# Patient Record
Sex: Male | Born: 1970 | Race: White | Hispanic: No | Marital: Single | Smoking: Former smoker
Health system: Southern US, Community
[De-identification: ages and names within clinical notes are randomized; demographics above are authoritative.]

## PROBLEM LIST (undated history)

## (undated) DIAGNOSIS — Z789 Other specified health status: Secondary | ICD-10-CM

## (undated) DIAGNOSIS — M419 Scoliosis, unspecified: Secondary | ICD-10-CM

## (undated) DIAGNOSIS — E7800 Pure hypercholesterolemia, unspecified: Secondary | ICD-10-CM

## (undated) DIAGNOSIS — F319 Bipolar disorder, unspecified: Secondary | ICD-10-CM

## (undated) DIAGNOSIS — S069XAA Unspecified intracranial injury with loss of consciousness status unknown, initial encounter: Secondary | ICD-10-CM

## (undated) DIAGNOSIS — F909 Attention-deficit hyperactivity disorder, unspecified type: Secondary | ICD-10-CM

## (undated) DIAGNOSIS — F419 Anxiety disorder, unspecified: Secondary | ICD-10-CM

## (undated) DIAGNOSIS — F32A Depression, unspecified: Secondary | ICD-10-CM

## (undated) DIAGNOSIS — F329 Major depressive disorder, single episode, unspecified: Secondary | ICD-10-CM

## (undated) DIAGNOSIS — S069X9A Unspecified intracranial injury with loss of consciousness of unspecified duration, initial encounter: Secondary | ICD-10-CM

---
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
## 2001-08-26 ENCOUNTER — Encounter: Payer: Self-pay | Admitting: Neurosurgery

## 2001-08-26 ENCOUNTER — Encounter: Admission: RE | Admit: 2001-08-26 | Discharge: 2001-08-26 | Payer: Self-pay | Admitting: Neurosurgery

## 2001-09-09 ENCOUNTER — Encounter: Payer: Self-pay | Admitting: Neurosurgery

## 2001-09-09 ENCOUNTER — Encounter: Admission: RE | Admit: 2001-09-09 | Discharge: 2001-09-09 | Payer: Self-pay | Admitting: Neurosurgery

## 2001-09-23 ENCOUNTER — Encounter: Payer: Self-pay | Admitting: Neurosurgery

## 2001-09-23 ENCOUNTER — Encounter: Admission: RE | Admit: 2001-09-23 | Discharge: 2001-09-23 | Payer: Self-pay | Admitting: Neurosurgery

## 2008-06-29 ENCOUNTER — Ambulatory Visit (HOSPITAL_COMMUNITY): Payer: Self-pay | Admitting: Psychology

## 2008-07-11 ENCOUNTER — Ambulatory Visit (HOSPITAL_COMMUNITY): Payer: Self-pay | Admitting: Psychology

## 2008-08-01 ENCOUNTER — Ambulatory Visit (HOSPITAL_COMMUNITY): Payer: Self-pay | Admitting: Psychology

## 2008-08-22 ENCOUNTER — Ambulatory Visit (HOSPITAL_COMMUNITY): Payer: Self-pay | Admitting: Psychology

## 2008-09-18 ENCOUNTER — Ambulatory Visit (HOSPITAL_COMMUNITY): Payer: Self-pay | Admitting: Psychology

## 2008-10-09 ENCOUNTER — Ambulatory Visit (HOSPITAL_COMMUNITY): Payer: Self-pay | Admitting: Psychology

## 2008-10-31 ENCOUNTER — Ambulatory Visit (HOSPITAL_COMMUNITY): Payer: Self-pay | Admitting: Psychology

## 2008-11-28 ENCOUNTER — Ambulatory Visit (HOSPITAL_COMMUNITY): Payer: Self-pay | Admitting: Psychology

## 2008-12-19 ENCOUNTER — Ambulatory Visit (HOSPITAL_COMMUNITY): Payer: Self-pay | Admitting: Psychology

## 2009-02-16 ENCOUNTER — Ambulatory Visit (HOSPITAL_COMMUNITY): Payer: Self-pay | Admitting: Psychology

## 2009-03-15 ENCOUNTER — Ambulatory Visit (HOSPITAL_COMMUNITY): Payer: Self-pay | Admitting: Psychology

## 2010-10-15 ENCOUNTER — Ambulatory Visit
Admission: RE | Admit: 2010-10-15 | Discharge: 2010-10-15 | Payer: Self-pay | Source: Home / Self Care | Attending: Neurology | Admitting: Neurology

## 2017-04-22 ENCOUNTER — Encounter: Payer: Self-pay | Admitting: Orthopaedic Surgery

## 2017-05-12 ENCOUNTER — Ambulatory Visit (INDEPENDENT_AMBULATORY_CARE_PROVIDER_SITE_OTHER): Payer: Medicare Other | Admitting: Urology

## 2017-05-12 DIAGNOSIS — E291 Testicular hypofunction: Secondary | ICD-10-CM

## 2017-08-18 ENCOUNTER — Ambulatory Visit: Payer: Medicare Other | Admitting: Urology

## 2017-08-18 DIAGNOSIS — E291 Testicular hypofunction: Secondary | ICD-10-CM | POA: Diagnosis not present

## 2017-10-05 DIAGNOSIS — Z79891 Long term (current) use of opiate analgesic: Secondary | ICD-10-CM | POA: Diagnosis not present

## 2017-10-05 DIAGNOSIS — M130 Polyarthritis, unspecified: Secondary | ICD-10-CM | POA: Diagnosis not present

## 2017-10-05 DIAGNOSIS — M545 Low back pain: Secondary | ICD-10-CM | POA: Diagnosis not present

## 2017-10-05 DIAGNOSIS — K5903 Drug induced constipation: Secondary | ICD-10-CM | POA: Diagnosis not present

## 2017-10-26 DIAGNOSIS — Z79899 Other long term (current) drug therapy: Secondary | ICD-10-CM | POA: Diagnosis not present

## 2017-12-03 DIAGNOSIS — M130 Polyarthritis, unspecified: Secondary | ICD-10-CM | POA: Diagnosis not present

## 2017-12-03 DIAGNOSIS — Z79891 Long term (current) use of opiate analgesic: Secondary | ICD-10-CM | POA: Diagnosis not present

## 2017-12-03 DIAGNOSIS — M545 Low back pain: Secondary | ICD-10-CM | POA: Diagnosis not present

## 2017-12-14 DIAGNOSIS — E291 Testicular hypofunction: Secondary | ICD-10-CM | POA: Diagnosis not present

## 2017-12-22 ENCOUNTER — Ambulatory Visit: Payer: Medicare Other | Admitting: Urology

## 2017-12-22 DIAGNOSIS — E291 Testicular hypofunction: Secondary | ICD-10-CM

## 2017-12-24 DIAGNOSIS — G894 Chronic pain syndrome: Secondary | ICD-10-CM | POA: Diagnosis not present

## 2017-12-24 DIAGNOSIS — R7301 Impaired fasting glucose: Secondary | ICD-10-CM | POA: Diagnosis not present

## 2017-12-24 DIAGNOSIS — R5381 Other malaise: Secondary | ICD-10-CM | POA: Diagnosis not present

## 2017-12-24 DIAGNOSIS — E782 Mixed hyperlipidemia: Secondary | ICD-10-CM | POA: Diagnosis not present

## 2017-12-28 DIAGNOSIS — K5900 Constipation, unspecified: Secondary | ICD-10-CM | POA: Diagnosis not present

## 2017-12-28 DIAGNOSIS — G894 Chronic pain syndrome: Secondary | ICD-10-CM | POA: Diagnosis not present

## 2017-12-28 DIAGNOSIS — K219 Gastro-esophageal reflux disease without esophagitis: Secondary | ICD-10-CM | POA: Diagnosis not present

## 2018-02-01 DIAGNOSIS — M545 Low back pain: Secondary | ICD-10-CM | POA: Diagnosis not present

## 2018-02-01 DIAGNOSIS — M130 Polyarthritis, unspecified: Secondary | ICD-10-CM | POA: Diagnosis not present

## 2018-02-01 DIAGNOSIS — Z79891 Long term (current) use of opiate analgesic: Secondary | ICD-10-CM | POA: Diagnosis not present

## 2018-02-01 DIAGNOSIS — K5903 Drug induced constipation: Secondary | ICD-10-CM | POA: Diagnosis not present

## 2018-02-09 ENCOUNTER — Other Ambulatory Visit: Payer: Self-pay

## 2018-02-09 NOTE — Patient Outreach (Signed)
[facility]) Care Management  02/09/2018  Omar Sutton Dec 12, 1970 [id number]   Medication Adherence call to Omar Sutton patient telephone number under [facility] belongs to someone else ,Epic has a number but not sure if this is a correct number left a message for patient to call back patient is due on Rosuvastatin 20 mg.    [facility] Management Direct Dial [phone]  Fax [phone] [email or web address]

## 2018-03-03 DIAGNOSIS — K5903 Drug induced constipation: Secondary | ICD-10-CM | POA: Diagnosis not present

## 2018-03-03 DIAGNOSIS — M461 Sacroiliitis, not elsewhere classified: Secondary | ICD-10-CM | POA: Diagnosis not present

## 2018-03-03 DIAGNOSIS — M130 Polyarthritis, unspecified: Secondary | ICD-10-CM | POA: Diagnosis not present

## 2018-03-08 ENCOUNTER — Other Ambulatory Visit: Payer: Self-pay

## 2018-03-08 NOTE — Patient Outreach (Signed)
[facility]) Care Management  03/08/2018  Omar Sutton Jan 31, 1971 [id number]   Medication Adherence call to Mr. Omar Sutton spoke with patient he said he is not taking this medication doctor took him off and told him to take fish oil for three month he is going to have an appointment in July/2019 and will go over it with doctor again.Omar Sutton is showing past due under [place] [employer]. On Rosuvastatin 20 mg.   [facility] Management Direct Dial [phone]  Fax [phone] [name]

## 2018-03-25 DIAGNOSIS — M545 Low back pain: Secondary | ICD-10-CM | POA: Diagnosis not present

## 2018-03-25 DIAGNOSIS — M461 Sacroiliitis, not elsewhere classified: Secondary | ICD-10-CM | POA: Diagnosis not present

## 2018-03-28 DIAGNOSIS — E559 Vitamin D deficiency, unspecified: Secondary | ICD-10-CM | POA: Diagnosis not present

## 2018-03-28 DIAGNOSIS — Z0000 Encounter for general adult medical examination without abnormal findings: Secondary | ICD-10-CM | POA: Diagnosis not present

## 2018-03-28 DIAGNOSIS — R7301 Impaired fasting glucose: Secondary | ICD-10-CM | POA: Diagnosis not present

## 2018-03-31 DIAGNOSIS — M461 Sacroiliitis, not elsewhere classified: Secondary | ICD-10-CM | POA: Diagnosis not present

## 2018-03-31 DIAGNOSIS — M545 Low back pain: Secondary | ICD-10-CM | POA: Diagnosis not present

## 2018-03-31 DIAGNOSIS — Z79899 Other long term (current) drug therapy: Secondary | ICD-10-CM | POA: Diagnosis not present

## 2018-03-31 DIAGNOSIS — M130 Polyarthritis, unspecified: Secondary | ICD-10-CM | POA: Diagnosis not present

## 2018-03-31 DIAGNOSIS — Z79891 Long term (current) use of opiate analgesic: Secondary | ICD-10-CM | POA: Diagnosis not present

## 2018-03-31 DIAGNOSIS — K5903 Drug induced constipation: Secondary | ICD-10-CM | POA: Diagnosis not present

## 2018-04-02 DIAGNOSIS — E559 Vitamin D deficiency, unspecified: Secondary | ICD-10-CM | POA: Diagnosis not present

## 2018-04-02 DIAGNOSIS — E782 Mixed hyperlipidemia: Secondary | ICD-10-CM | POA: Diagnosis not present

## 2018-04-02 DIAGNOSIS — K219 Gastro-esophageal reflux disease without esophagitis: Secondary | ICD-10-CM | POA: Diagnosis not present

## 2018-04-02 DIAGNOSIS — G894 Chronic pain syndrome: Secondary | ICD-10-CM | POA: Diagnosis not present

## 2018-04-22 DIAGNOSIS — M545 Low back pain: Secondary | ICD-10-CM | POA: Diagnosis not present

## 2018-04-22 DIAGNOSIS — M62830 Muscle spasm of back: Secondary | ICD-10-CM | POA: Diagnosis not present

## 2018-04-22 DIAGNOSIS — E559 Vitamin D deficiency, unspecified: Secondary | ICD-10-CM | POA: Diagnosis not present

## 2018-05-31 DIAGNOSIS — M461 Sacroiliitis, not elsewhere classified: Secondary | ICD-10-CM | POA: Diagnosis not present

## 2018-05-31 DIAGNOSIS — M130 Polyarthritis, unspecified: Secondary | ICD-10-CM | POA: Diagnosis not present

## 2018-05-31 DIAGNOSIS — Z79899 Other long term (current) drug therapy: Secondary | ICD-10-CM | POA: Diagnosis not present

## 2018-05-31 DIAGNOSIS — M545 Low back pain: Secondary | ICD-10-CM | POA: Diagnosis not present

## 2018-05-31 DIAGNOSIS — K5903 Drug induced constipation: Secondary | ICD-10-CM | POA: Diagnosis not present

## 2018-05-31 DIAGNOSIS — Z79891 Long term (current) use of opiate analgesic: Secondary | ICD-10-CM | POA: Diagnosis not present

## 2018-06-22 ENCOUNTER — Ambulatory Visit: Payer: Medicare Other | Admitting: Urology

## 2018-06-22 DIAGNOSIS — E291 Testicular hypofunction: Secondary | ICD-10-CM | POA: Diagnosis not present

## 2018-08-03 DIAGNOSIS — E559 Vitamin D deficiency, unspecified: Secondary | ICD-10-CM | POA: Diagnosis not present

## 2018-08-03 DIAGNOSIS — K219 Gastro-esophageal reflux disease without esophagitis: Secondary | ICD-10-CM | POA: Diagnosis not present

## 2018-08-24 DIAGNOSIS — Z79891 Long term (current) use of opiate analgesic: Secondary | ICD-10-CM | POA: Diagnosis not present

## 2018-08-24 DIAGNOSIS — M545 Low back pain: Secondary | ICD-10-CM | POA: Diagnosis not present

## 2018-08-24 DIAGNOSIS — Z79899 Other long term (current) drug therapy: Secondary | ICD-10-CM | POA: Diagnosis not present

## 2018-08-24 DIAGNOSIS — K5903 Drug induced constipation: Secondary | ICD-10-CM | POA: Diagnosis not present

## 2018-08-24 DIAGNOSIS — M130 Polyarthritis, unspecified: Secondary | ICD-10-CM | POA: Diagnosis not present

## 2018-08-24 DIAGNOSIS — M461 Sacroiliitis, not elsewhere classified: Secondary | ICD-10-CM | POA: Diagnosis not present

## 2018-10-04 DIAGNOSIS — R7301 Impaired fasting glucose: Secondary | ICD-10-CM | POA: Diagnosis not present

## 2018-10-04 DIAGNOSIS — E559 Vitamin D deficiency, unspecified: Secondary | ICD-10-CM | POA: Diagnosis not present

## 2018-10-04 DIAGNOSIS — E782 Mixed hyperlipidemia: Secondary | ICD-10-CM | POA: Diagnosis not present

## 2018-10-08 DIAGNOSIS — K219 Gastro-esophageal reflux disease without esophagitis: Secondary | ICD-10-CM | POA: Diagnosis not present

## 2018-10-08 DIAGNOSIS — E559 Vitamin D deficiency, unspecified: Secondary | ICD-10-CM | POA: Diagnosis not present

## 2018-10-08 DIAGNOSIS — E782 Mixed hyperlipidemia: Secondary | ICD-10-CM | POA: Diagnosis not present

## 2018-10-12 DIAGNOSIS — E782 Mixed hyperlipidemia: Secondary | ICD-10-CM | POA: Diagnosis not present

## 2018-10-12 DIAGNOSIS — G894 Chronic pain syndrome: Secondary | ICD-10-CM | POA: Diagnosis not present

## 2018-10-12 DIAGNOSIS — K219 Gastro-esophageal reflux disease without esophagitis: Secondary | ICD-10-CM | POA: Diagnosis not present

## 2018-10-22 DIAGNOSIS — H524 Presbyopia: Secondary | ICD-10-CM | POA: Diagnosis not present

## 2018-10-28 DIAGNOSIS — E782 Mixed hyperlipidemia: Secondary | ICD-10-CM | POA: Diagnosis not present

## 2018-10-28 DIAGNOSIS — K219 Gastro-esophageal reflux disease without esophagitis: Secondary | ICD-10-CM | POA: Diagnosis not present

## 2018-11-03 DIAGNOSIS — Z79899 Other long term (current) drug therapy: Secondary | ICD-10-CM | POA: Diagnosis not present

## 2018-11-03 DIAGNOSIS — K5903 Drug induced constipation: Secondary | ICD-10-CM | POA: Diagnosis not present

## 2018-11-03 DIAGNOSIS — M130 Polyarthritis, unspecified: Secondary | ICD-10-CM | POA: Diagnosis not present

## 2018-11-03 DIAGNOSIS — M461 Sacroiliitis, not elsewhere classified: Secondary | ICD-10-CM | POA: Diagnosis not present

## 2018-11-23 DIAGNOSIS — L02212 Cutaneous abscess of back [any part, except buttock]: Secondary | ICD-10-CM | POA: Diagnosis not present

## 2018-11-23 DIAGNOSIS — E782 Mixed hyperlipidemia: Secondary | ICD-10-CM | POA: Diagnosis not present

## 2018-11-23 DIAGNOSIS — K219 Gastro-esophageal reflux disease without esophagitis: Secondary | ICD-10-CM | POA: Diagnosis not present

## 2018-12-20 DIAGNOSIS — E782 Mixed hyperlipidemia: Secondary | ICD-10-CM | POA: Diagnosis not present

## 2018-12-20 DIAGNOSIS — K219 Gastro-esophageal reflux disease without esophagitis: Secondary | ICD-10-CM | POA: Diagnosis not present

## 2019-01-18 DIAGNOSIS — K219 Gastro-esophageal reflux disease without esophagitis: Secondary | ICD-10-CM | POA: Diagnosis not present

## 2019-01-18 DIAGNOSIS — E782 Mixed hyperlipidemia: Secondary | ICD-10-CM | POA: Diagnosis not present

## 2019-01-21 DIAGNOSIS — Z0000 Encounter for general adult medical examination without abnormal findings: Secondary | ICD-10-CM | POA: Diagnosis not present

## 2019-02-04 DIAGNOSIS — E782 Mixed hyperlipidemia: Secondary | ICD-10-CM | POA: Diagnosis not present

## 2019-02-04 DIAGNOSIS — R7301 Impaired fasting glucose: Secondary | ICD-10-CM | POA: Diagnosis not present

## 2019-02-04 DIAGNOSIS — E559 Vitamin D deficiency, unspecified: Secondary | ICD-10-CM | POA: Diagnosis not present

## 2019-02-11 DIAGNOSIS — E559 Vitamin D deficiency, unspecified: Secondary | ICD-10-CM | POA: Diagnosis not present

## 2019-02-11 DIAGNOSIS — Z0001 Encounter for general adult medical examination with abnormal findings: Secondary | ICD-10-CM | POA: Diagnosis not present

## 2019-02-11 DIAGNOSIS — K219 Gastro-esophageal reflux disease without esophagitis: Secondary | ICD-10-CM | POA: Diagnosis not present

## 2019-02-11 DIAGNOSIS — E782 Mixed hyperlipidemia: Secondary | ICD-10-CM | POA: Diagnosis not present

## 2019-03-02 DIAGNOSIS — E782 Mixed hyperlipidemia: Secondary | ICD-10-CM | POA: Diagnosis not present

## 2019-03-02 DIAGNOSIS — K219 Gastro-esophageal reflux disease without esophagitis: Secondary | ICD-10-CM | POA: Diagnosis not present

## 2019-03-29 DIAGNOSIS — K219 Gastro-esophageal reflux disease without esophagitis: Secondary | ICD-10-CM | POA: Diagnosis not present

## 2019-03-29 DIAGNOSIS — E782 Mixed hyperlipidemia: Secondary | ICD-10-CM | POA: Diagnosis not present

## 2019-04-18 DIAGNOSIS — E782 Mixed hyperlipidemia: Secondary | ICD-10-CM | POA: Diagnosis not present

## 2019-04-18 DIAGNOSIS — K219 Gastro-esophageal reflux disease without esophagitis: Secondary | ICD-10-CM | POA: Diagnosis not present

## 2019-05-10 DIAGNOSIS — M25552 Pain in left hip: Secondary | ICD-10-CM | POA: Diagnosis not present

## 2019-05-27 DIAGNOSIS — M25552 Pain in left hip: Secondary | ICD-10-CM | POA: Diagnosis not present

## 2019-05-27 DIAGNOSIS — M545 Low back pain: Secondary | ICD-10-CM | POA: Diagnosis not present

## 2019-06-01 DIAGNOSIS — M5136 Other intervertebral disc degeneration, lumbar region: Secondary | ICD-10-CM | POA: Diagnosis not present

## 2019-06-01 DIAGNOSIS — M48061 Spinal stenosis, lumbar region without neurogenic claudication: Secondary | ICD-10-CM | POA: Diagnosis not present

## 2019-06-01 DIAGNOSIS — M419 Scoliosis, unspecified: Secondary | ICD-10-CM | POA: Diagnosis not present

## 2019-06-01 DIAGNOSIS — M549 Dorsalgia, unspecified: Secondary | ICD-10-CM | POA: Diagnosis not present

## 2019-06-01 DIAGNOSIS — M545 Low back pain: Secondary | ICD-10-CM | POA: Diagnosis not present

## 2019-06-09 DIAGNOSIS — E782 Mixed hyperlipidemia: Secondary | ICD-10-CM | POA: Diagnosis not present

## 2019-06-09 DIAGNOSIS — K219 Gastro-esophageal reflux disease without esophagitis: Secondary | ICD-10-CM | POA: Diagnosis not present

## 2019-06-24 DIAGNOSIS — K219 Gastro-esophageal reflux disease without esophagitis: Secondary | ICD-10-CM | POA: Diagnosis not present

## 2019-06-24 DIAGNOSIS — E782 Mixed hyperlipidemia: Secondary | ICD-10-CM | POA: Diagnosis not present

## 2019-07-06 DIAGNOSIS — L720 Epidermal cyst: Secondary | ICD-10-CM | POA: Diagnosis not present

## 2019-08-03 DIAGNOSIS — E782 Mixed hyperlipidemia: Secondary | ICD-10-CM | POA: Diagnosis not present

## 2019-08-18 DIAGNOSIS — Q330 Congenital cystic lung: Secondary | ICD-10-CM | POA: Diagnosis not present

## 2019-08-18 DIAGNOSIS — D485 Neoplasm of uncertain behavior of skin: Secondary | ICD-10-CM | POA: Diagnosis not present

## 2019-08-31 DIAGNOSIS — E7849 Other hyperlipidemia: Secondary | ICD-10-CM | POA: Diagnosis not present

## 2019-09-29 DIAGNOSIS — E7849 Other hyperlipidemia: Secondary | ICD-10-CM | POA: Diagnosis not present

## 2019-09-30 DIAGNOSIS — M25552 Pain in left hip: Secondary | ICD-10-CM | POA: Diagnosis not present

## 2019-10-25 DIAGNOSIS — M5416 Radiculopathy, lumbar region: Secondary | ICD-10-CM | POA: Diagnosis not present

## 2019-10-25 DIAGNOSIS — M5126 Other intervertebral disc displacement, lumbar region: Secondary | ICD-10-CM | POA: Diagnosis not present

## 2019-10-25 DIAGNOSIS — M545 Low back pain: Secondary | ICD-10-CM | POA: Diagnosis not present

## 2019-11-09 ENCOUNTER — Emergency Department (HOSPITAL_COMMUNITY): Payer: Medicare Other

## 2019-11-09 ENCOUNTER — Other Ambulatory Visit: Payer: Self-pay

## 2019-11-09 ENCOUNTER — Encounter (HOSPITAL_COMMUNITY): Payer: Self-pay | Admitting: Radiology

## 2019-11-09 ENCOUNTER — Emergency Department (HOSPITAL_COMMUNITY)
Admission: EM | Admit: 2019-11-09 | Discharge: 2019-11-10 | Disposition: A | Discharge status: Home / Self Care | Payer: Medicare Other | Attending: Emergency Medicine | Admitting: Emergency Medicine

## 2019-11-09 DIAGNOSIS — S3993XA Unspecified injury of pelvis, initial encounter: Secondary | ICD-10-CM | POA: Diagnosis not present

## 2019-11-09 DIAGNOSIS — Y92410 Unspecified street and highway as the place of occurrence of the external cause: Secondary | ICD-10-CM | POA: Insufficient documentation

## 2019-11-09 DIAGNOSIS — Y999 Unspecified external cause status: Secondary | ICD-10-CM | POA: Insufficient documentation

## 2019-11-09 DIAGNOSIS — Y9389 Activity, other specified: Secondary | ICD-10-CM | POA: Diagnosis not present

## 2019-11-09 DIAGNOSIS — S6992XA Unspecified injury of left wrist, hand and finger(s), initial encounter: Secondary | ICD-10-CM | POA: Diagnosis present

## 2019-11-09 DIAGNOSIS — S62395A Other fracture of fourth metacarpal bone, left hand, initial encounter for closed fracture: Secondary | ICD-10-CM | POA: Diagnosis not present

## 2019-11-09 DIAGNOSIS — S199XXA Unspecified injury of neck, initial encounter: Secondary | ICD-10-CM | POA: Diagnosis not present

## 2019-11-09 DIAGNOSIS — R519 Headache, unspecified: Secondary | ICD-10-CM | POA: Insufficient documentation

## 2019-11-09 DIAGNOSIS — R52 Pain, unspecified: Secondary | ICD-10-CM | POA: Diagnosis not present

## 2019-11-09 DIAGNOSIS — R000 Tachycardia, unspecified: Secondary | ICD-10-CM | POA: Diagnosis not present

## 2019-11-09 DIAGNOSIS — S0990XA Unspecified injury of head, initial encounter: Secondary | ICD-10-CM | POA: Diagnosis not present

## 2019-11-09 DIAGNOSIS — W19XXXA Unspecified fall, initial encounter: Secondary | ICD-10-CM

## 2019-11-09 DIAGNOSIS — S62325A Displaced fracture of shaft of fourth metacarpal bone, left hand, initial encounter for closed fracture: Secondary | ICD-10-CM | POA: Diagnosis not present

## 2019-11-09 DIAGNOSIS — S3991XA Unspecified injury of abdomen, initial encounter: Secondary | ICD-10-CM | POA: Diagnosis not present

## 2019-11-09 DIAGNOSIS — S62355A Nondisplaced fracture of shaft of fourth metacarpal bone, left hand, initial encounter for closed fracture: Secondary | ICD-10-CM | POA: Diagnosis not present

## 2019-11-09 DIAGNOSIS — S62397A Other fracture of fifth metacarpal bone, left hand, initial encounter for closed fracture: Secondary | ICD-10-CM | POA: Diagnosis not present

## 2019-11-09 DIAGNOSIS — S6292XA Unspecified fracture of left wrist and hand, initial encounter for closed fracture: Secondary | ICD-10-CM

## 2019-11-09 DIAGNOSIS — R58 Hemorrhage, not elsewhere classified: Secondary | ICD-10-CM | POA: Diagnosis not present

## 2019-11-09 DIAGNOSIS — R0902 Hypoxemia: Secondary | ICD-10-CM | POA: Diagnosis not present

## 2019-11-09 DIAGNOSIS — E782 Mixed hyperlipidemia: Secondary | ICD-10-CM | POA: Diagnosis not present

## 2019-11-09 DIAGNOSIS — M542 Cervicalgia: Secondary | ICD-10-CM | POA: Diagnosis not present

## 2019-11-09 DIAGNOSIS — R222 Localized swelling, mass and lump, trunk: Secondary | ICD-10-CM | POA: Diagnosis not present

## 2019-11-09 DIAGNOSIS — S62615A Displaced fracture of proximal phalanx of left ring finger, initial encounter for closed fracture: Secondary | ICD-10-CM | POA: Diagnosis not present

## 2019-11-09 DIAGNOSIS — S3992XA Unspecified injury of lower back, initial encounter: Secondary | ICD-10-CM | POA: Diagnosis not present

## 2019-11-09 DIAGNOSIS — Z23 Encounter for immunization: Secondary | ICD-10-CM | POA: Diagnosis not present

## 2019-11-09 DIAGNOSIS — M438X6 Other specified deforming dorsopathies, lumbar region: Secondary | ICD-10-CM | POA: Diagnosis not present

## 2019-11-09 DIAGNOSIS — S299XXA Unspecified injury of thorax, initial encounter: Secondary | ICD-10-CM | POA: Diagnosis not present

## 2019-11-09 DIAGNOSIS — R0689 Other abnormalities of breathing: Secondary | ICD-10-CM | POA: Diagnosis not present

## 2019-11-09 LAB — CBC
HCT: 47.8 % (ref 39.0–52.0)
Hemoglobin: 15.9 g/dL (ref 13.0–17.0)
MCH: 30.7 pg (ref 26.0–34.0)
MCHC: 33.3 g/dL (ref 30.0–36.0)
MCV: 92.3 fL (ref 80.0–100.0)
Platelets: 253 10*3/uL (ref 150–400)
RBC: 5.18 MIL/uL (ref 4.22–5.81)
RDW: 12.3 % (ref 11.5–15.5)
WBC: 15.9 10*3/uL — ABNORMAL HIGH (ref 4.0–10.5)
nRBC: 0 % (ref 0.0–0.2)

## 2019-11-09 LAB — COMPREHENSIVE METABOLIC PANEL WITH GFR
ALT: 23 U/L (ref 0–44)
AST: 24 U/L (ref 15–41)
Albumin: 4.1 g/dL (ref 3.5–5.0)
Alkaline Phosphatase: 47 U/L (ref 38–126)
Anion gap: 8 (ref 5–15)
BUN: 13 mg/dL (ref 6–20)
CO2: 28 mmol/L (ref 22–32)
Calcium: 9.4 mg/dL (ref 8.9–10.3)
Chloride: 106 mmol/L (ref 98–111)
Creatinine, Ser: 1.16 mg/dL (ref 0.61–1.24)
GFR calc Af Amer: >60 mL/min
GFR calc non Af Amer: >60 mL/min
Glucose, Bld: 108 mg/dL — ABNORMAL HIGH (ref 70–99)
Potassium: 4.6 mmol/L (ref 3.5–5.1)
Sodium: 142 mmol/L (ref 135–145)
Total Bilirubin: 0.8 mg/dL (ref 0.3–1.2)
Total Protein: 6.6 g/dL (ref 6.5–8.1)

## 2019-11-09 LAB — I-STAT CHEM 8, ED
BUN: 15 mg/dL (ref 6–20)
Calcium, Ion: 1.18 mmol/L (ref 1.15–1.40)
Chloride: 105 mmol/L (ref 98–111)
Creatinine, Ser: 1.1 mg/dL (ref 0.61–1.24)
Glucose, Bld: 101 mg/dL — ABNORMAL HIGH (ref 70–99)
HCT: 46 % (ref 39.0–52.0)
Hemoglobin: 15.6 g/dL (ref 13.0–17.0)
Potassium: 4.5 mmol/L (ref 3.5–5.1)
Sodium: 142 mmol/L (ref 135–145)
TCO2: 28 mmol/L (ref 22–32)

## 2019-11-09 LAB — CBG MONITORING, ED: Glucose-Capillary: 98 mg/dL (ref 70–99)

## 2019-11-09 LAB — ETHANOL: Alcohol, Ethyl (B): <10 mg/dL

## 2019-11-09 MED ORDER — IOHEXOL 300 MG/ML  SOLN
100.0000 mL | Freq: Once | INTRAMUSCULAR | Status: AC | PRN
  Administered 2019-11-09: 100 mL via INTRAVENOUS

## 2019-11-09 MED ORDER — LIDOCAINE HCL (PF) 1 % IJ SOLN
5.0000 mL | Freq: Once | INTRAMUSCULAR | Status: AC
  Administered 2019-11-09: 22:00:00 5 mL
  Filled 2019-11-09: qty 5

## 2019-11-09 MED ORDER — TETANUS-DIPHTH-ACELL PERTUSSIS 5-2.5-18.5 LF-MCG/0.5 IM SUSP
0.5000 mL | Freq: Once | INTRAMUSCULAR | Status: AC
  Administered 2019-11-09: 23:00:00 0.5 mL via INTRAMUSCULAR
  Filled 2019-11-09: qty 0.5

## 2019-11-09 MED ORDER — CEPHALEXIN 250 MG PO CAPS
500.0000 mg | ORAL_CAPSULE | Freq: Once | ORAL | Status: AC
  Administered 2019-11-10: 500 mg via ORAL
  Filled 2019-11-09: qty 2

## 2019-11-09 MED ORDER — OXYCODONE-ACETAMINOPHEN 5-325 MG PO TABS: 1.0000 | ORAL_TABLET | ORAL | 0 refills | Status: DC | PRN

## 2019-11-09 MED ORDER — CEPHALEXIN 500 MG PO CAPS: 500.0000 mg | ORAL_CAPSULE | Freq: Four times a day (QID) | ORAL | 0 refills | Status: AC

## 2019-11-09 MED ORDER — OXYCODONE-ACETAMINOPHEN 5-325 MG PO TABS
1.0000 | ORAL_TABLET | Freq: Once | ORAL | Status: AC
  Administered 2019-11-09: 1 via ORAL
  Filled 2019-11-09: qty 1
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
## 2019-11-09 NOTE — ED Notes (Signed)
Pt able to ambulate to and from bathroom without assistance without complaint or impairment to gait

## 2019-11-09 NOTE — Progress Notes (Signed)
Orthopedic Tech Progress Note Patient Details:  Omar Sutton Jun 04, 1971 OS:[id number]  Ortho Devices Type of Ortho Device: Ace wrap, Ulna gutter splint Ortho Device/Splint Location: LUE Ortho Device/Splint Interventions: Ordered, Application   Post Interventions Patient Tolerated: Well Instructions Provided: Care of device   Staci Righter 11/09/2019, 10:01 PM
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
## 2019-11-09 NOTE — Discharge Instructions (Addendum)
Will need to follow-up with Dr. Grandville Silos, hand specialist for the injury to your left hand.  Call his office in the morning to schedule your appt. Leave hand in splint until you follow-up with him in office. Take medications as directed.  Do not drive while taking pain medication. Return here for any new/acute changes.
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
## 2019-11-10 ENCOUNTER — Other Ambulatory Visit: Payer: Self-pay | Admitting: Orthopedic Surgery

## 2019-11-10 ENCOUNTER — Encounter (HOSPITAL_BASED_OUTPATIENT_CLINIC_OR_DEPARTMENT_OTHER): Payer: Self-pay | Admitting: Orthopedic Surgery

## 2019-11-11 ENCOUNTER — Other Ambulatory Visit: Payer: Self-pay

## 2019-11-11 ENCOUNTER — Other Ambulatory Visit (HOSPITAL_COMMUNITY)
Admission: RE | Admit: 2019-11-11 | Discharge: 2019-11-11 | Disposition: A | Discharge status: Home / Self Care | Payer: Medicare Other | Source: Ambulatory Visit | Attending: Orthopedic Surgery | Admitting: Orthopedic Surgery

## 2019-11-11 DIAGNOSIS — Z01812 Encounter for preprocedural laboratory examination: Secondary | ICD-10-CM | POA: Insufficient documentation

## 2019-11-11 DIAGNOSIS — Z20822 Contact with and (suspected) exposure to covid-19: Secondary | ICD-10-CM | POA: Diagnosis not present

## 2019-11-11 LAB — SARS CORONAVIRUS 2 (TAT 6-24 HRS): SARS Coronavirus 2: NEGATIVE

## 2019-11-14 ENCOUNTER — Ambulatory Visit (HOSPITAL_COMMUNITY): Payer: Medicare Other

## 2019-11-14 ENCOUNTER — Ambulatory Visit (HOSPITAL_BASED_OUTPATIENT_CLINIC_OR_DEPARTMENT_OTHER): Payer: Medicare Other | Admitting: Anesthesiology

## 2019-11-14 ENCOUNTER — Encounter (HOSPITAL_BASED_OUTPATIENT_CLINIC_OR_DEPARTMENT_OTHER)
Admission: RE | Disposition: A | Discharge status: Home / Self Care | Payer: Self-pay | Source: Home / Self Care | Attending: Orthopedic Surgery

## 2019-11-14 ENCOUNTER — Ambulatory Visit (HOSPITAL_BASED_OUTPATIENT_CLINIC_OR_DEPARTMENT_OTHER)
Admission: RE | Admit: 2019-11-14 | Discharge: 2019-11-14 | Disposition: A | Discharge status: Home / Self Care | Payer: Medicare Other | Attending: Orthopedic Surgery | Admitting: Orthopedic Surgery

## 2019-11-14 ENCOUNTER — Other Ambulatory Visit: Payer: Self-pay

## 2019-11-14 ENCOUNTER — Encounter (HOSPITAL_BASED_OUTPATIENT_CLINIC_OR_DEPARTMENT_OTHER): Payer: Self-pay | Admitting: Orthopedic Surgery

## 2019-11-14 DIAGNOSIS — S62615D Displaced fracture of proximal phalanx of left ring finger, subsequent encounter for fracture with routine healing: Secondary | ICD-10-CM | POA: Diagnosis not present

## 2019-11-14 DIAGNOSIS — S61201A Unspecified open wound of left index finger without damage to nail, initial encounter: Secondary | ICD-10-CM | POA: Insufficient documentation

## 2019-11-14 DIAGNOSIS — Z87891 Personal history of nicotine dependence: Secondary | ICD-10-CM | POA: Diagnosis not present

## 2019-11-14 DIAGNOSIS — S61203A Unspecified open wound of left middle finger without damage to nail, initial encounter: Secondary | ICD-10-CM | POA: Diagnosis not present

## 2019-11-14 DIAGNOSIS — S62614A Displaced fracture of proximal phalanx of right ring finger, initial encounter for closed fracture: Secondary | ICD-10-CM | POA: Diagnosis not present

## 2019-11-14 DIAGNOSIS — S62615A Displaced fracture of proximal phalanx of left ring finger, initial encounter for closed fracture: Secondary | ICD-10-CM | POA: Diagnosis not present

## 2019-11-14 DIAGNOSIS — Z419 Encounter for procedure for purposes other than remedying health state, unspecified: Secondary | ICD-10-CM

## 2019-11-14 DIAGNOSIS — S61207A Unspecified open wound of left little finger without damage to nail, initial encounter: Secondary | ICD-10-CM | POA: Insufficient documentation

## 2019-11-14 HISTORY — PX: OPEN REDUCTION INTERNAL FIXATION (ORIF) PROXIMAL PHALANX: SHX6235

## 2019-11-14 HISTORY — DX: Unspecified intracranial injury with loss of consciousness status unknown, initial encounter: S06.9XAA

## 2019-11-14 HISTORY — DX: Anxiety disorder, unspecified: F41.9

## 2019-11-14 HISTORY — DX: Depression, unspecified: F32.A

## 2019-11-14 HISTORY — DX: Attention-deficit hyperactivity disorder, unspecified type: F90.9

## 2019-11-14 HISTORY — DX: Unspecified intracranial injury with loss of consciousness of unspecified duration, initial encounter: S06.9X9A

## 2019-11-14 HISTORY — DX: Pure hypercholesterolemia, unspecified: E78.00

## 2019-11-14 HISTORY — DX: Other motorcycle driver injured in collision with pedal cycle in traffic accident, initial encounter: V21.49XA

## 2019-11-14 HISTORY — PX: TENDON REPAIR: SHX5111

## 2019-11-14 HISTORY — DX: Bipolar disorder, unspecified: F31.9

## 2019-11-14 HISTORY — DX: Other specified health status: Z78.9

## 2019-11-14 HISTORY — DX: Scoliosis, unspecified: M41.9

## 2019-11-14 LAB — RAPID URINE DRUG SCREEN, HOSP PERFORMED
Amphetamines: POSITIVE — AB
Barbiturates: NOT DETECTED
Benzodiazepines: POSITIVE — AB
Cocaine: NOT DETECTED
Opiates: POSITIVE — AB
Tetrahydrocannabinol: POSITIVE — AB

## 2019-11-14 SURGERY — OPEN REDUCTION INTERNAL FIXATION (ORIF) PROXIMAL PHALANX
Anesthesia: General | Site: Finger | Laterality: Left

## 2019-11-14 MED ORDER — OXYCODONE HCL 5 MG PO TABS
5.0000 mg | ORAL_TABLET | Freq: Once | ORAL | Status: AC | PRN
  Administered 2019-11-14: 5 mg via ORAL

## 2019-11-14 MED ORDER — PROPOFOL 500 MG/50ML IV EMUL
INTRAVENOUS | Status: AC
  Filled 2019-11-14: qty 50

## 2019-11-14 MED ORDER — LIDOCAINE 2% (20 MG/ML) 5 ML SYRINGE
INTRAMUSCULAR | Status: AC
  Filled 2019-11-14: qty 5

## 2019-11-14 MED ORDER — FENTANYL CITRATE (PF) 100 MCG/2ML IJ SOLN
INTRAMUSCULAR | Status: DC | PRN
  Administered 2019-11-14: 100 ug via INTRAVENOUS
  Administered 2019-11-14 (×2): 50 ug via INTRAVENOUS

## 2019-11-14 MED ORDER — OXYCODONE HCL 5 MG PO TABS
ORAL_TABLET | ORAL | Status: AC
  Filled 2019-11-14: qty 1

## 2019-11-14 MED ORDER — FENTANYL CITRATE (PF) 100 MCG/2ML IJ SOLN
INTRAMUSCULAR | Status: AC
  Filled 2019-11-14: qty 2

## 2019-11-14 MED ORDER — DEXMEDETOMIDINE HCL 200 MCG/2ML IV SOLN
INTRAVENOUS | Status: DC | PRN
  Administered 2019-11-14: 12 ug via INTRAVENOUS

## 2019-11-14 MED ORDER — PROMETHAZINE HCL 25 MG/ML IJ SOLN: 6.2500 mg | INTRAMUSCULAR | Status: DC | PRN

## 2019-11-14 MED ORDER — ONDANSETRON HCL 4 MG/2ML IJ SOLN
INTRAMUSCULAR | Status: AC
  Filled 2019-11-14: qty 2

## 2019-11-14 MED ORDER — CEFAZOLIN SODIUM-DEXTROSE 2-4 GM/100ML-% IV SOLN
2.0000 g | INTRAVENOUS | Status: AC
  Administered 2019-11-14: 2 g via INTRAVENOUS

## 2019-11-14 MED ORDER — IBUPROFEN 200 MG PO TABS: 600.0000 mg | ORAL_TABLET | Freq: Four times a day (QID) | ORAL | Status: AC

## 2019-11-14 MED ORDER — DEXMEDETOMIDINE HCL IN NACL 200 MCG/50ML IV SOLN
INTRAVENOUS | Status: AC
  Filled 2019-11-14: qty 50

## 2019-11-14 MED ORDER — HYDROMORPHONE HCL 1 MG/ML IJ SOLN: 0.2500 mg | INTRAMUSCULAR | Status: DC | PRN

## 2019-11-14 MED ORDER — OXYCODONE HCL 5 MG/5ML PO SOLN: 5.0000 mg | Freq: Once | ORAL | Status: AC | PRN

## 2019-11-14 MED ORDER — ONDANSETRON HCL 4 MG/2ML IJ SOLN
INTRAMUSCULAR | Status: DC | PRN
  Administered 2019-11-14: 4 mg via INTRAVENOUS

## 2019-11-14 MED ORDER — CEFAZOLIN SODIUM-DEXTROSE 2-4 GM/100ML-% IV SOLN
INTRAVENOUS | Status: AC
  Filled 2019-11-14: qty 100

## 2019-11-14 MED ORDER — SODIUM CHLORIDE 0.9 % IR SOLN
Status: DC | PRN
  Administered 2019-11-14: 1000 mL

## 2019-11-14 MED ORDER — MIDAZOLAM HCL 5 MG/5ML IJ SOLN
INTRAMUSCULAR | Status: DC | PRN
  Administered 2019-11-14: 2 mg via INTRAVENOUS

## 2019-11-14 MED ORDER — MIDAZOLAM HCL 2 MG/2ML IJ SOLN
INTRAMUSCULAR | Status: AC
  Filled 2019-11-14: qty 2

## 2019-11-14 MED ORDER — ACETAMINOPHEN 325 MG PO TABS: 650.0000 mg | ORAL_TABLET | Freq: Four times a day (QID) | ORAL | Status: AC

## 2019-11-14 MED ORDER — DEXAMETHASONE SODIUM PHOSPHATE 10 MG/ML IJ SOLN
INTRAMUSCULAR | Status: DC | PRN
  Administered 2019-11-14: 5 mg via INTRAVENOUS

## 2019-11-14 MED ORDER — MIDAZOLAM HCL 2 MG/2ML IJ SOLN: 1.0000 mg | INTRAMUSCULAR | Status: DC | PRN

## 2019-11-14 MED ORDER — LACTATED RINGERS IV SOLN: INTRAVENOUS | Status: DC

## 2019-11-14 MED ORDER — CHLORHEXIDINE GLUCONATE 4 % EX LIQD: 60.0000 mL | Freq: Once | CUTANEOUS | Status: DC

## 2019-11-14 MED ORDER — BUPIVACAINE HCL (PF) 0.5 % IJ SOLN
INTRAMUSCULAR | Status: DC | PRN
  Administered 2019-11-14: 10 mL

## 2019-11-14 MED ORDER — OXYCODONE HCL 5 MG PO TABS: 5.0000 mg | ORAL_TABLET | Freq: Four times a day (QID) | ORAL | 0 refills | Status: AC | PRN

## 2019-11-14 MED ORDER — DEXAMETHASONE SODIUM PHOSPHATE 10 MG/ML IJ SOLN
INTRAMUSCULAR | Status: AC
  Filled 2019-11-14: qty 1

## 2019-11-14 MED ORDER — PROPOFOL 10 MG/ML IV BOLUS
INTRAVENOUS | Status: DC | PRN
  Administered 2019-11-14: 200 mg via INTRAVENOUS

## 2019-11-14 MED ORDER — FENTANYL CITRATE (PF) 100 MCG/2ML IJ SOLN: 50.0000 ug | INTRAMUSCULAR | Status: DC | PRN

## 2019-11-14 MED ORDER — MEPERIDINE HCL 25 MG/ML IJ SOLN: 6.2500 mg | INTRAMUSCULAR | Status: DC | PRN

## 2019-11-14 MED ORDER — LIDOCAINE 2% (20 MG/ML) 5 ML SYRINGE
INTRAMUSCULAR | Status: DC | PRN
  Administered 2019-11-14: 60 mg via INTRAVENOUS

## 2019-11-14 MED ORDER — LIDOCAINE HCL (PF) 1 % IJ SOLN
INTRAMUSCULAR | Status: AC
  Filled 2019-11-14: qty 30

## 2019-11-14 SURGICAL SUPPLY — 68 items
APL PRP STRL LF DISP 70% ISPRP (MISCELLANEOUS) ×1
BIT DRILL 1.1 (BIT) ×2
BIT DRILL 1.1MM (BIT) ×1
BIT DRILL 60X20X1.1XQC TMX (BIT) IMPLANT
BIT DRL 60X20X1.1XQC TMX (BIT) ×1
BLADE SURG 15 STRL LF DISP TIS (BLADE) ×2 IMPLANT
BLADE SURG 15 STRL SS (BLADE) ×6
BNDG CMPR 9X4 STRL LF SNTH (GAUZE/BANDAGES/DRESSINGS) ×1
BNDG COHESIVE 2X5 TAN STRL LF (GAUZE/BANDAGES/DRESSINGS) ×2 IMPLANT
BNDG COHESIVE 4X5 TAN STRL (GAUZE/BANDAGES/DRESSINGS) ×3 IMPLANT
BNDG ESMARK 4X9 LF (GAUZE/BANDAGES/DRESSINGS) ×2 IMPLANT
BNDG GAUZE ELAST 4 BULKY (GAUZE/BANDAGES/DRESSINGS) ×3 IMPLANT
BRUSH SCRUB EZ PLAIN DRY (MISCELLANEOUS) ×2 IMPLANT
CAP PIN PROTECTOR ORTHO WHT (CAP) IMPLANT
CHLORAPREP W/TINT 26 (MISCELLANEOUS) ×3 IMPLANT
CORD BIPOLAR FORCEPS 12FT (ELECTRODE) ×3 IMPLANT
COVER BACK TABLE 60X90IN (DRAPES) ×3 IMPLANT
COVER MAYO STAND STRL (DRAPES) ×3 IMPLANT
CUFF TOURN SGL QUICK 18X4 (TOURNIQUET CUFF) IMPLANT
DECANTER SPIKE VIAL GLASS SM (MISCELLANEOUS) ×2 IMPLANT
DRAPE C-ARM 42X72 X-RAY (DRAPES) ×3 IMPLANT
DRAPE EXTREMITY T 121X128X90 (DISPOSABLE) ×3 IMPLANT
DRAPE SURG 17X23 STRL (DRAPES) ×3 IMPLANT
DRIVER BIT 1.5 (TRAUMA) ×2 IMPLANT
DRSG EMULSION OIL 3X3 NADH (GAUZE/BANDAGES/DRESSINGS) ×3 IMPLANT
GAUZE SPONGE 4X4 12PLY STRL LF (GAUZE/BANDAGES/DRESSINGS) ×3 IMPLANT
GLOVE BIO SURGEON STRL SZ7.5 (GLOVE) ×3 IMPLANT
GLOVE BIOGEL PI IND STRL 7.0 (GLOVE) ×1 IMPLANT
GLOVE BIOGEL PI IND STRL 8 (GLOVE) ×1 IMPLANT
GLOVE BIOGEL PI INDICATOR 7.0 (GLOVE) ×2
GLOVE BIOGEL PI INDICATOR 8 (GLOVE) ×2
GLOVE ECLIPSE 6.5 STRL STRAW (GLOVE) ×3 IMPLANT
GOWN STRL REUS W/ TWL LRG LVL3 (GOWN DISPOSABLE) ×2 IMPLANT
GOWN STRL REUS W/TWL LRG LVL3 (GOWN DISPOSABLE) ×6
GOWN STRL REUS W/TWL XL LVL3 (GOWN DISPOSABLE) ×3 IMPLANT
K-WIRE 9  SMOOTH .045 (WIRE) IMPLANT
K-WIRE DBL TRONS .035X6 (WIRE) ×3
KWIRE DBL TRONS .035X6 (WIRE) IMPLANT
LOCK SCREW 1.5X9MM (Screw) ×9 IMPLANT
NDL HYPO 25X1 1.5 SAFETY (NEEDLE) IMPLANT
NEEDLE HYPO 25X1 1.5 SAFETY (NEEDLE) ×3 IMPLANT
NS IRRIG 1000ML POUR BTL (IV SOLUTION) ×3 IMPLANT
PACK BASIN DAY SURGERY FS (CUSTOM PROCEDURE TRAY) ×3 IMPLANT
PADDING CAST ABS 4INX4YD NS (CAST SUPPLIES) ×2
PADDING CAST ABS COTTON 4X4 ST (CAST SUPPLIES) IMPLANT
PLATE T SMALL 1.5MM (Plate) ×2 IMPLANT
SCREW L 1.5X12 (Screw) ×2 IMPLANT
SCREW LOCK 1.5X9MM (Screw) IMPLANT
SCREW LOCKING 1.5X10 (Screw) ×4 IMPLANT
SCREW LOCKING 1.5X13MM (Screw) ×6 IMPLANT
SLING ARM FOAM STRAP LRG (SOFTGOODS) ×2 IMPLANT
SPLINT PLASTER CAST XFAST 3X15 (CAST SUPPLIES) IMPLANT
SPLINT PLASTER XTRA FASTSET 3X (CAST SUPPLIES) ×16
STOCKINETTE 6  STRL (DRAPES) ×2
STOCKINETTE 6 STRL (DRAPES) ×1 IMPLANT
SUCTION FRAZIER HANDLE 10FR (MISCELLANEOUS) ×2
SUCTION TUBE FRAZIER 10FR DISP (MISCELLANEOUS) IMPLANT
SUT FIBERWIRE 4-0 18 TAPR NDL (SUTURE) ×3
SUT MAXBRAID #2 CVD NDL (SUTURE) ×2 IMPLANT
SUT VICRYL 4-0 PS2 18IN ABS (SUTURE) ×2 IMPLANT
SUT VICRYL RAPIDE 4/0 PS 2 (SUTURE) ×4 IMPLANT
SUTURE FIBERWR 4-0 18 TAPR NDL (SUTURE) IMPLANT
SYR BULB 3OZ (MISCELLANEOUS) ×2 IMPLANT
SYR CONTROL 10ML LL (SYRINGE) ×2 IMPLANT
TOWEL GREEN STERILE FF (TOWEL DISPOSABLE) ×3 IMPLANT
TUBE CONNECTING 20'X1/4 (TUBING) ×1
TUBE CONNECTING 20X1/4 (TUBING) ×1 IMPLANT
UNDERPAD 30X36 HEAVY ABSORB (UNDERPADS AND DIAPERS) ×3 IMPLANT

## 2019-11-14 NOTE — Transfer of Care (Signed)
Immediate Anesthesia Transfer of Care Note  Patient: Omar Sutton  Procedure(s) Performed: OPEN REDUCTION INTERNAL FIXATION (ORIF) OF LEFT RING FINGER FRACTURE (Left Finger) TENDON REPAIR, DEBRIDEMENT AND FLAP CLOSURE LEFT INDEX AND LONG FINGERS (Left Finger)  Patient Location: PACU  Anesthesia Type:General  Level of Consciousness: drowsy  Airway & Oxygen Therapy: Patient Spontanous Breathing and Patient connected to face mask oxygen  Post-op Assessment: Report given to RN and Post -op Vital signs reviewed and stable  Post vital signs: Reviewed and stable  Last Vitals:  Vitals Value Taken Time  BP 140/87 11/14/19 1533  Temp    Pulse 84 11/14/19 1534  Resp 43 11/14/19 1535  SpO2 100 % 11/14/19 1534  Vitals shown include unvalidated device data.  Last Pain:  Vitals:   11/14/19 1156  TempSrc: Oral  PainSc: 6          Complications: No apparent anesthesia complications

## 2019-11-14 NOTE — Interval H&P Note (Signed)
History and Physical Interval Note:  11/14/2019 12:51 PM  Omar Sutton  has presented today for surgery, with the diagnosis of LEFT RING FINGER FRACTURE WITH INDEX AND LONG FINGER WOUNDS.  The various methods of treatment have been discussed with the patient and family. After consideration of risks, benefits and other options for treatment, the patient has consented to  Procedure(s): OPEN REDUCTION INTERNAL FIXATION (ORIF) OF LEFT RING FINGER FRACTURE WITH POSSIBLE DEBRIDEMENT OF LONG AND INDEX FINGERS (Left) as a surgical intervention.  The patient's history has been reviewed, patient examined, no change in status, stable for surgery.  I have reviewed the patient's chart and labs.  Questions were answered to the patient's satisfaction.     Jolyn Nap

## 2019-11-14 NOTE — Anesthesia Procedure Notes (Signed)
Procedure Name: LMA Insertion Date/Time: 11/14/2019 1:54 PM Performed by: Lieutenant Diego, CRNA Pre-anesthesia Checklist: Patient identified, Emergency Drugs available, Suction available and Patient being monitored Patient Re-evaluated:Patient Re-evaluated prior to induction Oxygen Delivery Method: Circle system utilized Preoxygenation: Pre-oxygenation with 100% oxygen Induction Type: IV induction Ventilation: Mask ventilation without difficulty LMA: LMA inserted LMA Size: 4.0 Number of attempts: 1 Placement Confirmation: positive ETCO2 and breath sounds checked- equal and bilateral Tube secured with: Tape Dental Injury: Teeth and Oropharynx as per pre-operative assessment
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
## 2019-11-14 NOTE — Discharge Instructions (Signed)
Discharge Instructions   You have a dressing with a plaster splint incorporated in it. Move your fingers as much as possible, making a full fist and fully opening the fist. Elevate your hand to reduce pain & swelling of the digits.  Ice over the operative site may be helpful to reduce pain & swelling.  DO NOT USE HEAT. Pain medicine has been prescribed for you.  TAKE 600 mg IBUPROFEN and 650 mg TYLENOL OTC EVERY 6 HOURS TOGETHER. Oxycodone will be used as a rescue medicine for SEVERE post operative pain. Leave the dressing in place until you return to our office.  You may shower, but keep the bandage clean & dry.  You may drive a car when you are off of prescription pain medications and can safely control your vehicle with both hands. Call our office and return to clinic 10-15 days from the date of surgery for first post operative evaluation.   Please call [phone] during normal business hours or [phone] after hours for any problems. Including the following:  - excessive redness of the incisions - drainage for more than 4 days - fever of more than 101.5 F  *Please note that pain medications will not be refilled after hours or on weekends.  Patient indicates he is not working outside of the home. No activities with the left hand until returning to clinic for first pot operative evaluation.   Post Anesthesia Home Care Instructions  Activity: Get plenty of rest for the remainder of the day. A responsible individual must stay with you for 24 hours following the procedure.  For the next 24 hours, DO NOT: -Drive a car -Paediatric nurse -Drink alcoholic beverages -Take any medication unless instructed by your physician -Make any legal decisions or sign important papers.  Meals: Start with liquid foods such as gelatin or soup. Progress to regular foods as tolerated. Avoid greasy, spicy, heavy foods. If nausea and/or vomiting occur, drink only clear liquids until the nausea  and/or vomiting subsides. Call your physician if vomiting continues.  Special Instructions/Symptoms: Your throat may feel dry or sore from the anesthesia or the breathing tube placed in your throat during surgery. If this causes discomfort, gargle with warm salt water. The discomfort should disappear within 24 hours.  If you had a scopolamine patch placed behind your ear for the management of post- operative nausea and/or vomiting:  1. The medication in the patch is effective for 72 hours, after which it should be removed.  Wrap patch in a tissue and discard in the trash. Wash hands thoroughly with soap and water. 2. You may remove the patch earlier than 72 hours if you experience unpleasant side effects which may include dry mouth, dizziness or visual disturbances. 3. Avoid touching the patch. Wash your hands with soap and water after contact with the patch.

## 2019-11-14 NOTE — Anesthesia Preprocedure Evaluation (Signed)
Anesthesia Evaluation  Patient identified by MRN, date of birth, ID band Patient awake    Reviewed: Allergy & Precautions, NPO status , Patient's Chart, lab work & pertinent test results  Airway Mallampati: II  TM Distance: >3 FB Neck ROM: Full    Dental no notable dental hx.    Pulmonary neg pulmonary ROS, former smoker,    Pulmonary exam normal breath sounds clear to auscultation       Cardiovascular negative cardio ROS Normal cardiovascular exam Rhythm:Regular Rate:Normal     Neuro/Psych Anxiety Depression Bipolar Disorder negative neurological ROS  negative psych ROS   GI/Hepatic negative GI ROS, Neg liver ROS,   Endo/Other  negative endocrine ROS  Renal/GU negative Renal ROS  negative genitourinary   Musculoskeletal negative musculoskeletal ROS (+)   Abdominal   Peds negative pediatric ROS (+)  Hematology negative hematology ROS (+)   Anesthesia Other Findings   Reproductive/Obstetrics negative OB ROS                             Anesthesia Physical Anesthesia Plan  ASA: II  Anesthesia Plan: General   Post-op Pain Management:    Induction: Intravenous  PONV Risk Score and Plan: 2 and Ondansetron, Midazolam and Treatment may vary due to age or medical condition  Airway Management Planned: LMA  Additional Equipment:   Intra-op Plan:   Post-operative Plan: Extubation in OR  Informed Consent: I have reviewed the patients History and Physical, chart, labs and discussed the procedure including the risks, benefits and alternatives for the proposed anesthesia with the patient or authorized representative who has indicated his/her understanding and acceptance.     Dental advisory given  Plan Discussed with: CRNA  Anesthesia Plan Comments:         Anesthesia Quick Evaluation

## 2019-11-15 NOTE — Anesthesia Postprocedure Evaluation (Signed)
Anesthesia Post Note  Patient: Omar Sutton  Procedure(s) Performed: OPEN REDUCTION INTERNAL FIXATION (ORIF) OF LEFT RING FINGER FRACTURE (Left Finger) TENDON REPAIR, DEBRIDEMENT AND FLAP CLOSURE LEFT INDEX AND LONG FINGERS (Left Finger)     Patient location during evaluation: PACU Anesthesia Type: General Level of consciousness: awake and alert Pain management: pain level controlled Vital Signs Assessment: post-procedure vital signs reviewed and stable Respiratory status: spontaneous breathing, nonlabored ventilation and respiratory function stable Cardiovascular status: blood pressure returned to baseline and stable Postop Assessment: no apparent nausea or vomiting Anesthetic complications: no    Last Vitals:  Vitals:   11/14/19 1600 11/14/19 1621  BP: 131/85 130/80  Pulse: 84 76  Resp: 16 18  Temp:  36.5 C  SpO2: 100% 100%    Last Pain:  Vitals:   11/14/19 1621  TempSrc:   PainSc: [name]

## 2019-11-29 DIAGNOSIS — S66391D Other injury of extensor muscle, fascia and tendon of left index finger at wrist and hand level, subsequent encounter: Secondary | ICD-10-CM | POA: Diagnosis not present

## 2019-11-29 DIAGNOSIS — S66303D Unspecified injury of extensor muscle, fascia and tendon of left middle finger at wrist and hand level, subsequent encounter: Secondary | ICD-10-CM | POA: Diagnosis not present

## 2019-11-29 DIAGNOSIS — S62615D Displaced fracture of proximal phalanx of left ring finger, subsequent encounter for fracture with routine healing: Secondary | ICD-10-CM | POA: Diagnosis not present

## 2019-11-29 DIAGNOSIS — S62615A Displaced fracture of proximal phalanx of left ring finger, initial encounter for closed fracture: Secondary | ICD-10-CM | POA: Diagnosis not present

## 2019-11-29 DIAGNOSIS — S66313A Strain of extensor muscle, fascia and tendon of left middle finger at wrist and hand level, initial encounter: Secondary | ICD-10-CM | POA: Diagnosis not present

## 2019-11-30 DIAGNOSIS — E782 Mixed hyperlipidemia: Secondary | ICD-10-CM | POA: Diagnosis not present

## 2019-11-30 DIAGNOSIS — K219 Gastro-esophageal reflux disease without esophagitis: Secondary | ICD-10-CM | POA: Diagnosis not present

## 2019-12-09 DIAGNOSIS — M79642 Pain in left hand: Secondary | ICD-10-CM | POA: Diagnosis not present

## 2019-12-09 DIAGNOSIS — M25442 Effusion, left hand: Secondary | ICD-10-CM | POA: Diagnosis not present

## 2019-12-09 DIAGNOSIS — S66391D Other injury of extensor muscle, fascia and tendon of left index finger at wrist and hand level, subsequent encounter: Secondary | ICD-10-CM | POA: Diagnosis not present

## 2019-12-09 DIAGNOSIS — M25642 Stiffness of left hand, not elsewhere classified: Secondary | ICD-10-CM | POA: Diagnosis not present

## 2019-12-09 DIAGNOSIS — S66303D Unspecified injury of extensor muscle, fascia and tendon of left middle finger at wrist and hand level, subsequent encounter: Secondary | ICD-10-CM | POA: Diagnosis not present

## 2019-12-14 DIAGNOSIS — M25642 Stiffness of left hand, not elsewhere classified: Secondary | ICD-10-CM | POA: Diagnosis not present

## 2019-12-14 DIAGNOSIS — M25442 Effusion, left hand: Secondary | ICD-10-CM | POA: Diagnosis not present

## 2019-12-14 DIAGNOSIS — S66303D Unspecified injury of extensor muscle, fascia and tendon of left middle finger at wrist and hand level, subsequent encounter: Secondary | ICD-10-CM | POA: Diagnosis not present

## 2019-12-14 DIAGNOSIS — M79642 Pain in left hand: Secondary | ICD-10-CM | POA: Diagnosis not present

## 2019-12-14 DIAGNOSIS — S66391D Other injury of extensor muscle, fascia and tendon of left index finger at wrist and hand level, subsequent encounter: Secondary | ICD-10-CM | POA: Diagnosis not present

## 2019-12-19 DIAGNOSIS — M25642 Stiffness of left hand, not elsewhere classified: Secondary | ICD-10-CM | POA: Diagnosis not present

## 2019-12-19 DIAGNOSIS — S66391D Other injury of extensor muscle, fascia and tendon of left index finger at wrist and hand level, subsequent encounter: Secondary | ICD-10-CM | POA: Diagnosis not present

## 2019-12-19 DIAGNOSIS — M79642 Pain in left hand: Secondary | ICD-10-CM | POA: Diagnosis not present

## 2019-12-19 DIAGNOSIS — S66303D Unspecified injury of extensor muscle, fascia and tendon of left middle finger at wrist and hand level, subsequent encounter: Secondary | ICD-10-CM | POA: Diagnosis not present

## 2019-12-19 DIAGNOSIS — M25442 Effusion, left hand: Secondary | ICD-10-CM | POA: Diagnosis not present

## 2019-12-21 DIAGNOSIS — M25442 Effusion, left hand: Secondary | ICD-10-CM | POA: Diagnosis not present

## 2019-12-21 DIAGNOSIS — M25642 Stiffness of left hand, not elsewhere classified: Secondary | ICD-10-CM | POA: Diagnosis not present

## 2019-12-21 DIAGNOSIS — S66391D Other injury of extensor muscle, fascia and tendon of left index finger at wrist and hand level, subsequent encounter: Secondary | ICD-10-CM | POA: Diagnosis not present

## 2019-12-21 DIAGNOSIS — S66303D Unspecified injury of extensor muscle, fascia and tendon of left middle finger at wrist and hand level, subsequent encounter: Secondary | ICD-10-CM | POA: Diagnosis not present

## 2019-12-21 DIAGNOSIS — M79642 Pain in left hand: Secondary | ICD-10-CM | POA: Diagnosis not present

## 2019-12-28 DIAGNOSIS — M25442 Effusion, left hand: Secondary | ICD-10-CM | POA: Diagnosis not present

## 2019-12-28 DIAGNOSIS — M25642 Stiffness of left hand, not elsewhere classified: Secondary | ICD-10-CM | POA: Diagnosis not present

## 2019-12-28 DIAGNOSIS — S66391D Other injury of extensor muscle, fascia and tendon of left index finger at wrist and hand level, subsequent encounter: Secondary | ICD-10-CM | POA: Diagnosis not present

## 2019-12-28 DIAGNOSIS — S66303D Unspecified injury of extensor muscle, fascia and tendon of left middle finger at wrist and hand level, subsequent encounter: Secondary | ICD-10-CM | POA: Diagnosis not present

## 2019-12-28 DIAGNOSIS — M79642 Pain in left hand: Secondary | ICD-10-CM | POA: Diagnosis not present

## 2019-12-29 DIAGNOSIS — E782 Mixed hyperlipidemia: Secondary | ICD-10-CM | POA: Diagnosis not present

## 2019-12-29 DIAGNOSIS — K219 Gastro-esophageal reflux disease without esophagitis: Secondary | ICD-10-CM | POA: Diagnosis not present

## 2020-01-04 DIAGNOSIS — S66391D Other injury of extensor muscle, fascia and tendon of left index finger at wrist and hand level, subsequent encounter: Secondary | ICD-10-CM | POA: Diagnosis not present

## 2020-01-04 DIAGNOSIS — M25642 Stiffness of left hand, not elsewhere classified: Secondary | ICD-10-CM | POA: Diagnosis not present

## 2020-01-04 DIAGNOSIS — M79642 Pain in left hand: Secondary | ICD-10-CM | POA: Diagnosis not present

## 2020-01-04 DIAGNOSIS — M25442 Effusion, left hand: Secondary | ICD-10-CM | POA: Diagnosis not present

## 2020-01-04 DIAGNOSIS — S66303D Unspecified injury of extensor muscle, fascia and tendon of left middle finger at wrist and hand level, subsequent encounter: Secondary | ICD-10-CM | POA: Diagnosis not present

## 2020-01-05 DIAGNOSIS — S66313A Strain of extensor muscle, fascia and tendon of left middle finger at wrist and hand level, initial encounter: Secondary | ICD-10-CM | POA: Diagnosis not present

## 2020-01-05 DIAGNOSIS — S66321A Laceration of extensor muscle, fascia and tendon of left index finger at wrist and hand level, initial encounter: Secondary | ICD-10-CM | POA: Diagnosis not present

## 2020-01-06 DIAGNOSIS — M25442 Effusion, left hand: Secondary | ICD-10-CM | POA: Diagnosis not present

## 2020-01-06 DIAGNOSIS — M25642 Stiffness of left hand, not elsewhere classified: Secondary | ICD-10-CM | POA: Diagnosis not present

## 2020-01-06 DIAGNOSIS — M79642 Pain in left hand: Secondary | ICD-10-CM | POA: Diagnosis not present

## 2020-01-06 DIAGNOSIS — S66303D Unspecified injury of extensor muscle, fascia and tendon of left middle finger at wrist and hand level, subsequent encounter: Secondary | ICD-10-CM | POA: Diagnosis not present

## 2020-01-06 DIAGNOSIS — S66391D Other injury of extensor muscle, fascia and tendon of left index finger at wrist and hand level, subsequent encounter: Secondary | ICD-10-CM | POA: Diagnosis not present

## 2020-01-09 DIAGNOSIS — M25442 Effusion, left hand: Secondary | ICD-10-CM | POA: Diagnosis not present

## 2020-01-09 DIAGNOSIS — S66303D Unspecified injury of extensor muscle, fascia and tendon of left middle finger at wrist and hand level, subsequent encounter: Secondary | ICD-10-CM | POA: Diagnosis not present

## 2020-01-09 DIAGNOSIS — S66391D Other injury of extensor muscle, fascia and tendon of left index finger at wrist and hand level, subsequent encounter: Secondary | ICD-10-CM | POA: Diagnosis not present

## 2020-01-09 DIAGNOSIS — M79642 Pain in left hand: Secondary | ICD-10-CM | POA: Diagnosis not present

## 2020-01-09 DIAGNOSIS — M25642 Stiffness of left hand, not elsewhere classified: Secondary | ICD-10-CM | POA: Diagnosis not present

## 2020-01-11 DIAGNOSIS — M25642 Stiffness of left hand, not elsewhere classified: Secondary | ICD-10-CM | POA: Diagnosis not present

## 2020-01-11 DIAGNOSIS — M79642 Pain in left hand: Secondary | ICD-10-CM | POA: Diagnosis not present

## 2020-01-11 DIAGNOSIS — S66303D Unspecified injury of extensor muscle, fascia and tendon of left middle finger at wrist and hand level, subsequent encounter: Secondary | ICD-10-CM | POA: Diagnosis not present

## 2020-01-11 DIAGNOSIS — S66391D Other injury of extensor muscle, fascia and tendon of left index finger at wrist and hand level, subsequent encounter: Secondary | ICD-10-CM | POA: Diagnosis not present

## 2020-01-11 DIAGNOSIS — M25442 Effusion, left hand: Secondary | ICD-10-CM | POA: Diagnosis not present

## 2020-01-18 DIAGNOSIS — S66303D Unspecified injury of extensor muscle, fascia and tendon of left middle finger at wrist and hand level, subsequent encounter: Secondary | ICD-10-CM | POA: Diagnosis not present

## 2020-01-18 DIAGNOSIS — S66391D Other injury of extensor muscle, fascia and tendon of left index finger at wrist and hand level, subsequent encounter: Secondary | ICD-10-CM | POA: Diagnosis not present

## 2020-01-18 DIAGNOSIS — M79642 Pain in left hand: Secondary | ICD-10-CM | POA: Diagnosis not present

## 2020-01-18 DIAGNOSIS — M25642 Stiffness of left hand, not elsewhere classified: Secondary | ICD-10-CM | POA: Diagnosis not present

## 2020-01-18 DIAGNOSIS — M25442 Effusion, left hand: Secondary | ICD-10-CM | POA: Diagnosis not present

## 2020-01-23 DIAGNOSIS — M25642 Stiffness of left hand, not elsewhere classified: Secondary | ICD-10-CM | POA: Diagnosis not present

## 2020-01-23 DIAGNOSIS — S66391D Other injury of extensor muscle, fascia and tendon of left index finger at wrist and hand level, subsequent encounter: Secondary | ICD-10-CM | POA: Diagnosis not present

## 2020-01-23 DIAGNOSIS — M79642 Pain in left hand: Secondary | ICD-10-CM | POA: Diagnosis not present

## 2020-01-23 DIAGNOSIS — M25442 Effusion, left hand: Secondary | ICD-10-CM | POA: Diagnosis not present

## 2020-01-23 DIAGNOSIS — S66303D Unspecified injury of extensor muscle, fascia and tendon of left middle finger at wrist and hand level, subsequent encounter: Secondary | ICD-10-CM | POA: Diagnosis not present

## 2020-01-25 DIAGNOSIS — M25442 Effusion, left hand: Secondary | ICD-10-CM | POA: Diagnosis not present

## 2020-01-25 DIAGNOSIS — S66303D Unspecified injury of extensor muscle, fascia and tendon of left middle finger at wrist and hand level, subsequent encounter: Secondary | ICD-10-CM | POA: Diagnosis not present

## 2020-01-25 DIAGNOSIS — S66391D Other injury of extensor muscle, fascia and tendon of left index finger at wrist and hand level, subsequent encounter: Secondary | ICD-10-CM | POA: Diagnosis not present

## 2020-01-25 DIAGNOSIS — M79642 Pain in left hand: Secondary | ICD-10-CM | POA: Diagnosis not present

## 2020-01-25 DIAGNOSIS — M25642 Stiffness of left hand, not elsewhere classified: Secondary | ICD-10-CM | POA: Diagnosis not present

## 2020-01-27 DIAGNOSIS — K219 Gastro-esophageal reflux disease without esophagitis: Secondary | ICD-10-CM | POA: Diagnosis not present

## 2020-01-27 DIAGNOSIS — E782 Mixed hyperlipidemia: Secondary | ICD-10-CM | POA: Diagnosis not present

## 2020-01-30 DIAGNOSIS — S66391D Other injury of extensor muscle, fascia and tendon of left index finger at wrist and hand level, subsequent encounter: Secondary | ICD-10-CM | POA: Diagnosis not present

## 2020-01-30 DIAGNOSIS — S66303D Unspecified injury of extensor muscle, fascia and tendon of left middle finger at wrist and hand level, subsequent encounter: Secondary | ICD-10-CM | POA: Diagnosis not present

## 2020-01-30 DIAGNOSIS — M79642 Pain in left hand: Secondary | ICD-10-CM | POA: Diagnosis not present

## 2020-01-30 DIAGNOSIS — M25642 Stiffness of left hand, not elsewhere classified: Secondary | ICD-10-CM | POA: Diagnosis not present

## 2020-01-30 DIAGNOSIS — M25442 Effusion, left hand: Secondary | ICD-10-CM | POA: Diagnosis not present

## 2020-02-01 DIAGNOSIS — M25642 Stiffness of left hand, not elsewhere classified: Secondary | ICD-10-CM | POA: Diagnosis not present

## 2020-02-01 DIAGNOSIS — M25442 Effusion, left hand: Secondary | ICD-10-CM | POA: Diagnosis not present

## 2020-02-01 DIAGNOSIS — S66391D Other injury of extensor muscle, fascia and tendon of left index finger at wrist and hand level, subsequent encounter: Secondary | ICD-10-CM | POA: Diagnosis not present

## 2020-02-01 DIAGNOSIS — S66303D Unspecified injury of extensor muscle, fascia and tendon of left middle finger at wrist and hand level, subsequent encounter: Secondary | ICD-10-CM | POA: Diagnosis not present

## 2020-02-01 DIAGNOSIS — M79642 Pain in left hand: Secondary | ICD-10-CM | POA: Diagnosis not present

## 2020-02-06 DIAGNOSIS — S66391D Other injury of extensor muscle, fascia and tendon of left index finger at wrist and hand level, subsequent encounter: Secondary | ICD-10-CM | POA: Diagnosis not present

## 2020-02-06 DIAGNOSIS — M25442 Effusion, left hand: Secondary | ICD-10-CM | POA: Diagnosis not present

## 2020-02-06 DIAGNOSIS — M79642 Pain in left hand: Secondary | ICD-10-CM | POA: Diagnosis not present

## 2020-02-06 DIAGNOSIS — S66303D Unspecified injury of extensor muscle, fascia and tendon of left middle finger at wrist and hand level, subsequent encounter: Secondary | ICD-10-CM | POA: Diagnosis not present

## 2020-02-06 DIAGNOSIS — M25642 Stiffness of left hand, not elsewhere classified: Secondary | ICD-10-CM | POA: Diagnosis not present

## 2020-02-08 DIAGNOSIS — S66391D Other injury of extensor muscle, fascia and tendon of left index finger at wrist and hand level, subsequent encounter: Secondary | ICD-10-CM | POA: Diagnosis not present

## 2020-02-08 DIAGNOSIS — M25642 Stiffness of left hand, not elsewhere classified: Secondary | ICD-10-CM | POA: Diagnosis not present

## 2020-02-08 DIAGNOSIS — M25442 Effusion, left hand: Secondary | ICD-10-CM | POA: Diagnosis not present

## 2020-02-08 DIAGNOSIS — S66303D Unspecified injury of extensor muscle, fascia and tendon of left middle finger at wrist and hand level, subsequent encounter: Secondary | ICD-10-CM | POA: Diagnosis not present

## 2020-02-08 DIAGNOSIS — M79642 Pain in left hand: Secondary | ICD-10-CM | POA: Diagnosis not present

## 2020-03-16 DIAGNOSIS — S069X9A Unspecified intracranial injury with loss of consciousness of unspecified duration, initial encounter: Secondary | ICD-10-CM | POA: Diagnosis not present

## 2020-03-16 DIAGNOSIS — Z87891 Personal history of nicotine dependence: Secondary | ICD-10-CM | POA: Diagnosis not present

## 2020-03-16 DIAGNOSIS — E7800 Pure hypercholesterolemia, unspecified: Secondary | ICD-10-CM | POA: Diagnosis not present

## 2020-03-16 DIAGNOSIS — Z299 Encounter for prophylactic measures, unspecified: Secondary | ICD-10-CM | POA: Diagnosis not present

## 2020-03-16 DIAGNOSIS — R569 Unspecified convulsions: Secondary | ICD-10-CM | POA: Diagnosis not present

## 2020-05-25 DIAGNOSIS — R5383 Other fatigue: Secondary | ICD-10-CM | POA: Diagnosis not present

## 2020-05-25 DIAGNOSIS — Z299 Encounter for prophylactic measures, unspecified: Secondary | ICD-10-CM | POA: Diagnosis not present

## 2020-05-31 DIAGNOSIS — R7989 Other specified abnormal findings of blood chemistry: Secondary | ICD-10-CM | POA: Diagnosis not present
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
## 2020-06-07 DIAGNOSIS — Z299 Encounter for prophylactic measures, unspecified: Secondary | ICD-10-CM | POA: Diagnosis not present

## 2020-06-07 DIAGNOSIS — R7989 Other specified abnormal findings of blood chemistry: Secondary | ICD-10-CM | POA: Diagnosis not present

## 2020-06-07 DIAGNOSIS — Z87891 Personal history of nicotine dependence: Secondary | ICD-10-CM | POA: Diagnosis not present

## 2020-06-10 IMAGING — RF DG FINGER RING 2+V*L*
1 series · 4 of 4 positions shown · non-contrast
Comparison: 11/09/2019

CLINICAL DATA: Fourth proximal phalangeal fracture.

EXAM:
LEFT RING FINGER 2+V; DG C-ARM 1-60 MIN

[Series 1: run · 4 of 4 slices shown]
[im 1/4]
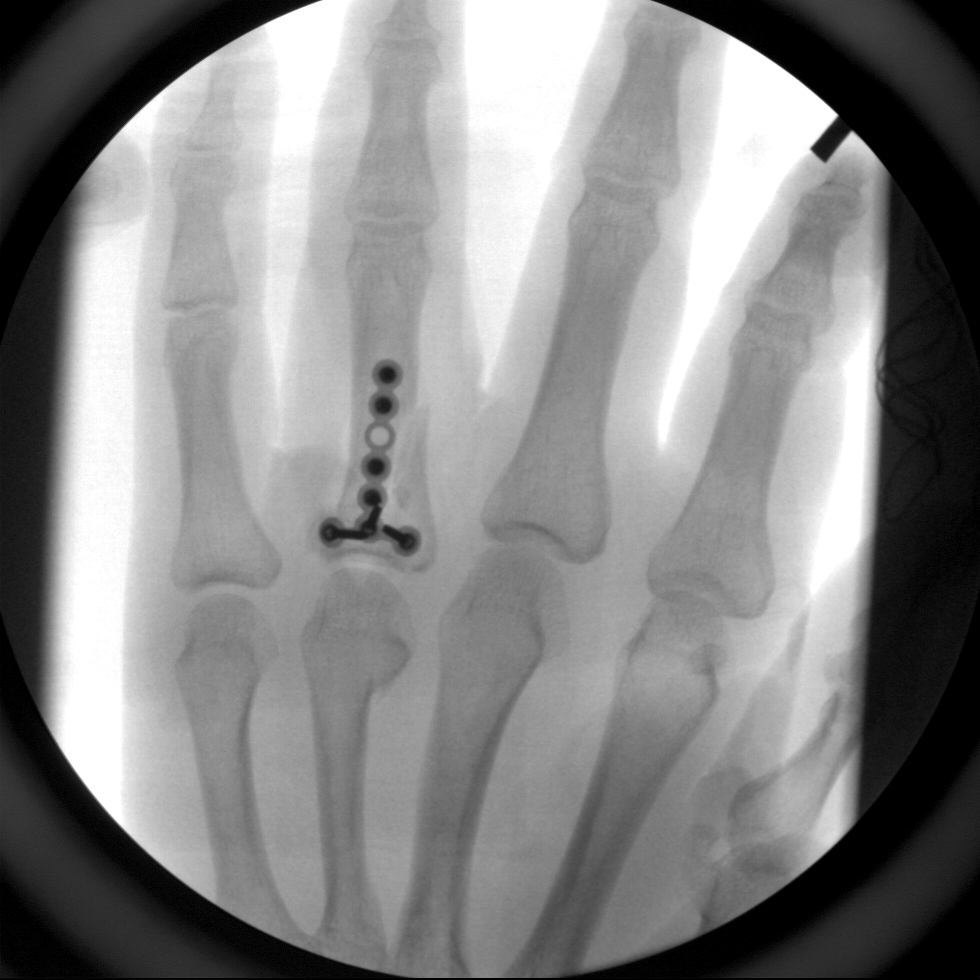
[im 2/4]
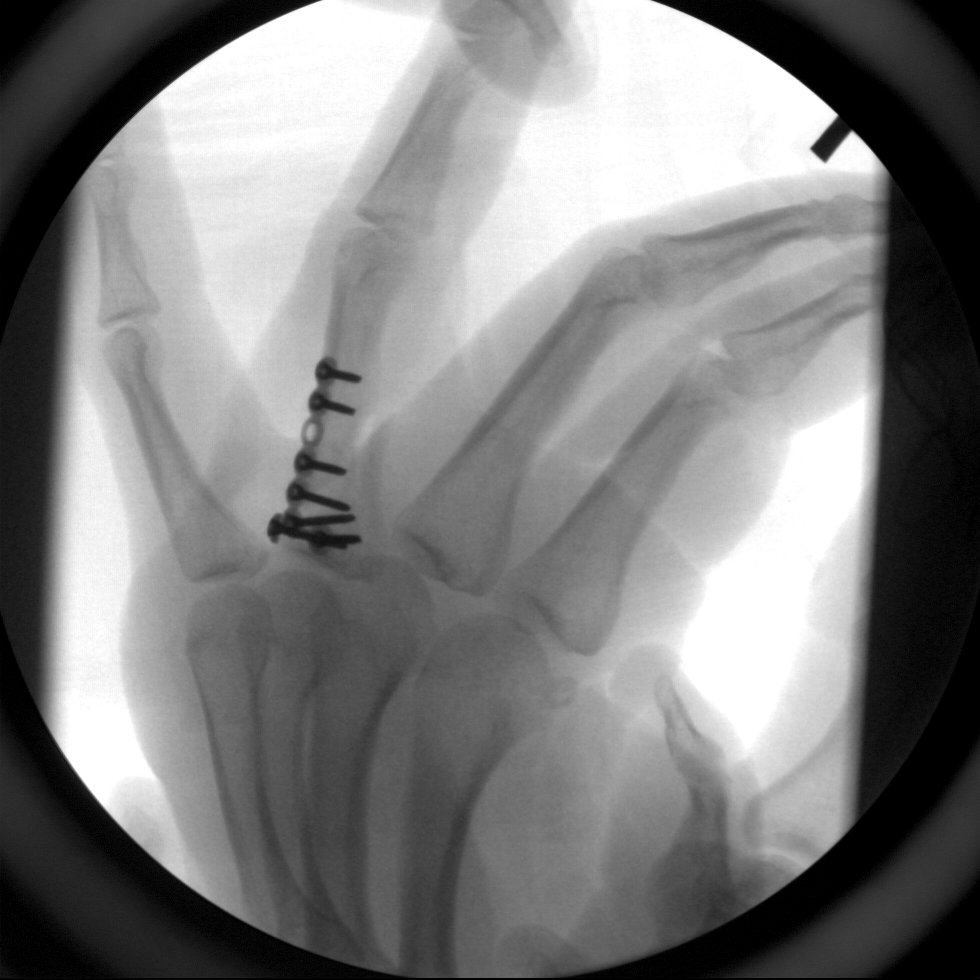
[im 3/4]
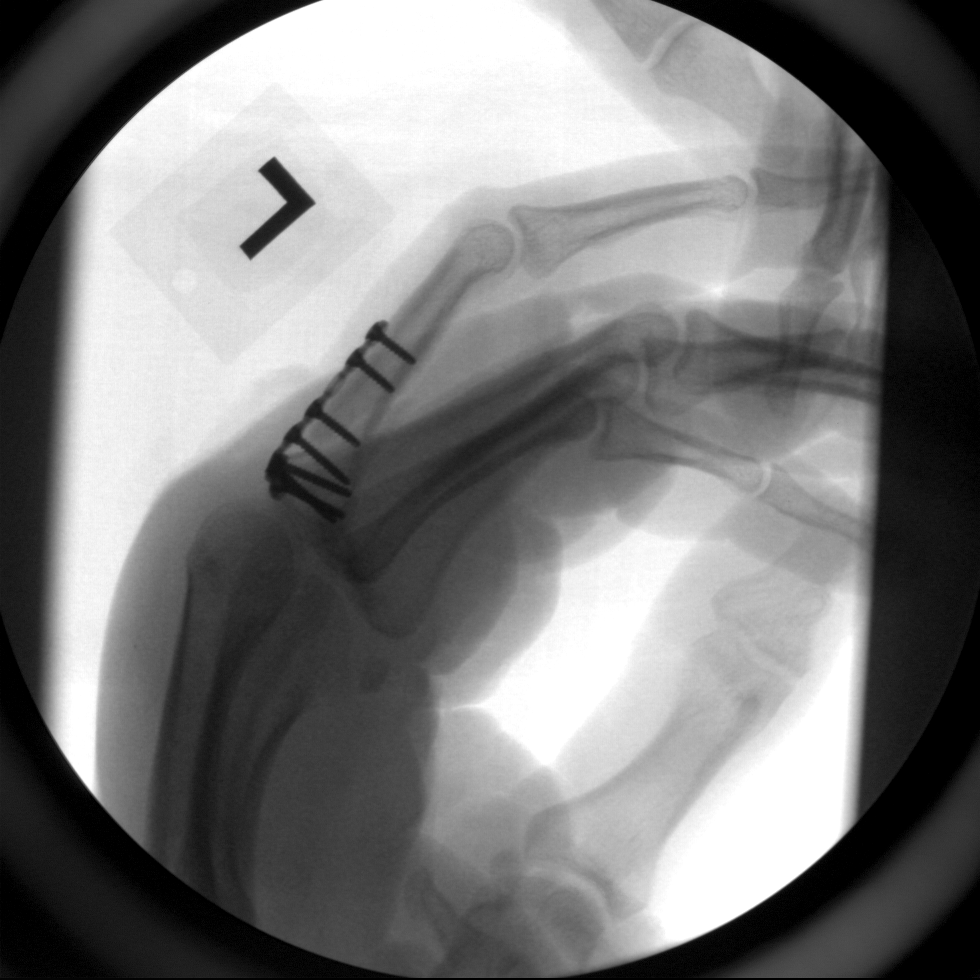
[im 4/4]
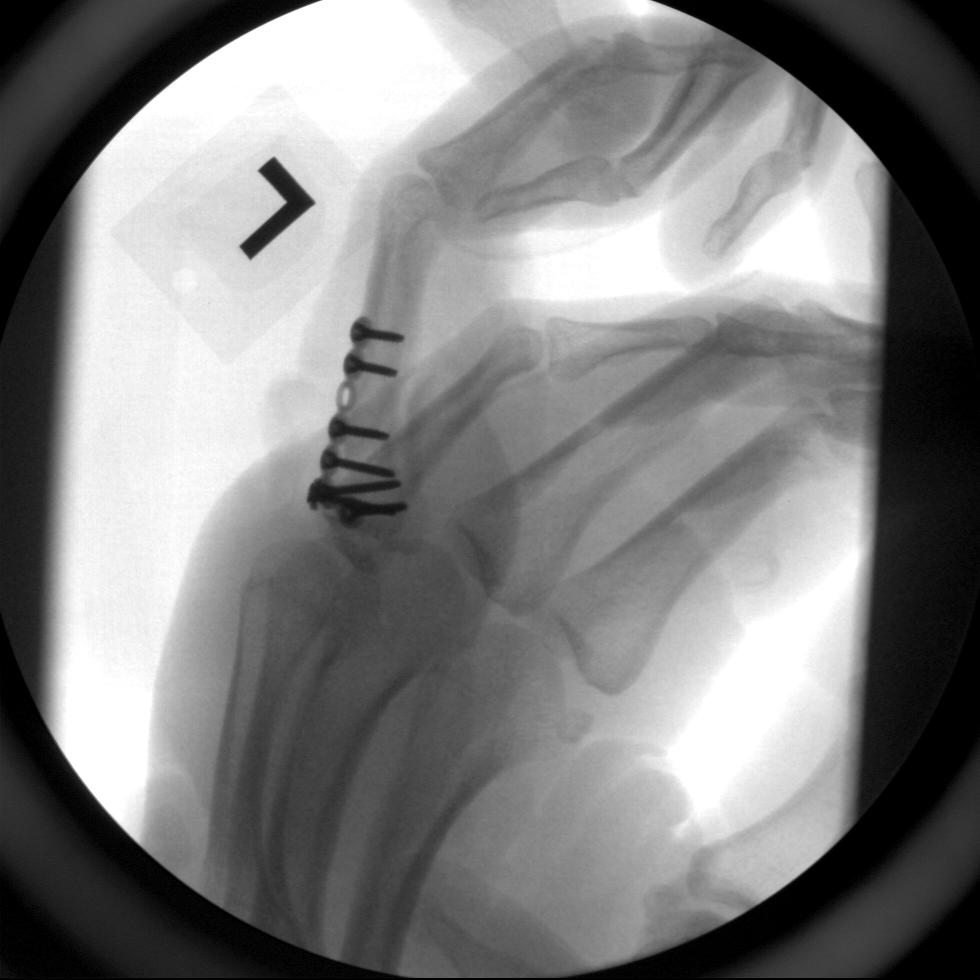

[4 of 4 positions shown; findings below may reference images not displayed]

FLUOROSCOPY TIME:  Fluoroscopy Time:  38 seconds

Radiation Exposure Index (if provided by the fluoroscopic device):
Not available

Number of Acquired Spot Images: 4
FINDINGS: Fixation sideplate is noted with multiple fixation screws fourth
proximal phalanx. Minimally displaced fracture of the fourth
metacarpal head is as well. Fracture fragments in the proximal
phalanx are in near anatomic alignment.
IMPRESSION: ORIF fourth proximal phalangeal fracture.

## 2020-06-19 DIAGNOSIS — Z87891 Personal history of nicotine dependence: Secondary | ICD-10-CM | POA: Diagnosis not present

## 2020-06-19 DIAGNOSIS — S069X9A Unspecified intracranial injury with loss of consciousness of unspecified duration, initial encounter: Secondary | ICD-10-CM | POA: Diagnosis not present

## 2020-06-19 DIAGNOSIS — Z299 Encounter for prophylactic measures, unspecified: Secondary | ICD-10-CM | POA: Diagnosis not present

## 2020-07-23 DIAGNOSIS — S52502D Unspecified fracture of the lower end of left radius, subsequent encounter for closed fracture with routine healing: Secondary | ICD-10-CM | POA: Diagnosis not present

## 2020-07-23 DIAGNOSIS — Z0000 Encounter for general adult medical examination without abnormal findings: Secondary | ICD-10-CM | POA: Diagnosis not present

## 2020-07-23 DIAGNOSIS — R5383 Other fatigue: Secondary | ICD-10-CM | POA: Diagnosis not present

## 2020-07-23 DIAGNOSIS — S6292XD Unspecified fracture of left wrist and hand, subsequent encounter for fracture with routine healing: Secondary | ICD-10-CM | POA: Diagnosis not present

## 2020-07-23 DIAGNOSIS — Z79899 Other long term (current) drug therapy: Secondary | ICD-10-CM | POA: Diagnosis not present

## 2020-07-23 DIAGNOSIS — Z299 Encounter for prophylactic measures, unspecified: Secondary | ICD-10-CM | POA: Diagnosis not present

## 2020-07-23 DIAGNOSIS — Z7189 Other specified counseling: Secondary | ICD-10-CM | POA: Diagnosis not present

## 2020-07-23 DIAGNOSIS — E7800 Pure hypercholesterolemia, unspecified: Secondary | ICD-10-CM | POA: Diagnosis not present

## 2020-07-23 DIAGNOSIS — M19042 Primary osteoarthritis, left hand: Secondary | ICD-10-CM | POA: Diagnosis not present

## 2020-07-23 DIAGNOSIS — M79642 Pain in left hand: Secondary | ICD-10-CM | POA: Diagnosis not present

## 2020-07-24 DIAGNOSIS — R7989 Other specified abnormal findings of blood chemistry: Secondary | ICD-10-CM | POA: Diagnosis not present

## 2020-07-24 DIAGNOSIS — E7800 Pure hypercholesterolemia, unspecified: Secondary | ICD-10-CM | POA: Diagnosis not present

## 2020-07-24 DIAGNOSIS — R5383 Other fatigue: Secondary | ICD-10-CM | POA: Diagnosis not present

## 2020-07-24 DIAGNOSIS — Z79899 Other long term (current) drug therapy: Secondary | ICD-10-CM | POA: Diagnosis not present

## 2020-08-23 DIAGNOSIS — Z299 Encounter for prophylactic measures, unspecified: Secondary | ICD-10-CM | POA: Diagnosis not present

## 2020-08-23 DIAGNOSIS — S069X9A Unspecified intracranial injury with loss of consciousness of unspecified duration, initial encounter: Secondary | ICD-10-CM | POA: Diagnosis not present

## 2020-08-23 DIAGNOSIS — Z23 Encounter for immunization: Secondary | ICD-10-CM | POA: Diagnosis not present

## 2020-08-23 DIAGNOSIS — R7989 Other specified abnormal findings of blood chemistry: Secondary | ICD-10-CM | POA: Diagnosis not present

## 2020-09-21 DIAGNOSIS — S069X9A Unspecified intracranial injury with loss of consciousness of unspecified duration, initial encounter: Secondary | ICD-10-CM | POA: Diagnosis not present

## 2020-09-21 DIAGNOSIS — R569 Unspecified convulsions: Secondary | ICD-10-CM | POA: Diagnosis not present

## 2020-09-21 DIAGNOSIS — Z87891 Personal history of nicotine dependence: Secondary | ICD-10-CM | POA: Diagnosis not present

## 2020-09-21 DIAGNOSIS — Z299 Encounter for prophylactic measures, unspecified: Secondary | ICD-10-CM | POA: Diagnosis not present

## 2020-10-18 DIAGNOSIS — H5213 Myopia, bilateral: Secondary | ICD-10-CM | POA: Diagnosis not present

## 2020-10-18 DIAGNOSIS — H52223 Regular astigmatism, bilateral: Secondary | ICD-10-CM | POA: Diagnosis not present

## 2020-10-18 DIAGNOSIS — D3131 Benign neoplasm of right choroid: Secondary | ICD-10-CM | POA: Diagnosis not present

## 2020-10-18 DIAGNOSIS — H524 Presbyopia: Secondary | ICD-10-CM | POA: Diagnosis not present

## 2020-10-19 DIAGNOSIS — Z299 Encounter for prophylactic measures, unspecified: Secondary | ICD-10-CM | POA: Diagnosis not present

## 2020-10-19 DIAGNOSIS — R569 Unspecified convulsions: Secondary | ICD-10-CM | POA: Diagnosis not present

## 2020-11-19 DIAGNOSIS — Z87891 Personal history of nicotine dependence: Secondary | ICD-10-CM | POA: Diagnosis not present

## 2020-11-19 DIAGNOSIS — Z299 Encounter for prophylactic measures, unspecified: Secondary | ICD-10-CM | POA: Diagnosis not present

## 2020-12-21 DIAGNOSIS — Z299 Encounter for prophylactic measures, unspecified: Secondary | ICD-10-CM | POA: Diagnosis not present

## 2021-01-21 DIAGNOSIS — Z299 Encounter for prophylactic measures, unspecified: Secondary | ICD-10-CM | POA: Diagnosis not present

## 2021-02-26 DIAGNOSIS — R569 Unspecified convulsions: Secondary | ICD-10-CM | POA: Diagnosis not present

## 2021-02-26 DIAGNOSIS — Z299 Encounter for prophylactic measures, unspecified: Secondary | ICD-10-CM | POA: Diagnosis not present

## 2021-04-05 DIAGNOSIS — S069X9A Unspecified intracranial injury with loss of consciousness of unspecified duration, initial encounter: Secondary | ICD-10-CM | POA: Diagnosis not present

## 2021-04-05 DIAGNOSIS — Z299 Encounter for prophylactic measures, unspecified: Secondary | ICD-10-CM | POA: Diagnosis not present

## 2021-04-05 DIAGNOSIS — R569 Unspecified convulsions: Secondary | ICD-10-CM | POA: Diagnosis not present

## 2021-05-24 DIAGNOSIS — Z0000 Encounter for general adult medical examination without abnormal findings: Secondary | ICD-10-CM | POA: Diagnosis not present

## 2021-05-24 DIAGNOSIS — E7800 Pure hypercholesterolemia, unspecified: Secondary | ICD-10-CM | POA: Diagnosis not present

## 2021-05-24 DIAGNOSIS — Z299 Encounter for prophylactic measures, unspecified: Secondary | ICD-10-CM | POA: Diagnosis not present

## 2021-05-24 DIAGNOSIS — R5383 Other fatigue: Secondary | ICD-10-CM | POA: Diagnosis not present

## 2021-05-24 DIAGNOSIS — Z7189 Other specified counseling: Secondary | ICD-10-CM | POA: Diagnosis not present

## 2021-05-24 DIAGNOSIS — Z79899 Other long term (current) drug therapy: Secondary | ICD-10-CM | POA: Diagnosis not present

## 2021-05-29 DIAGNOSIS — E7800 Pure hypercholesterolemia, unspecified: Secondary | ICD-10-CM | POA: Diagnosis not present

## 2021-05-29 DIAGNOSIS — Z79899 Other long term (current) drug therapy: Secondary | ICD-10-CM | POA: Diagnosis not present

## 2021-05-29 DIAGNOSIS — R5383 Other fatigue: Secondary | ICD-10-CM | POA: Diagnosis not present

## 2021-06-24 DIAGNOSIS — R569 Unspecified convulsions: Secondary | ICD-10-CM | POA: Diagnosis not present

## 2021-06-24 DIAGNOSIS — Z299 Encounter for prophylactic measures, unspecified: Secondary | ICD-10-CM | POA: Diagnosis not present

## 2021-06-24 DIAGNOSIS — R5383 Other fatigue: Secondary | ICD-10-CM | POA: Diagnosis not present

## 2021-07-26 DIAGNOSIS — F17210 Nicotine dependence, cigarettes, uncomplicated: Secondary | ICD-10-CM | POA: Diagnosis not present

## 2021-07-26 DIAGNOSIS — L089 Local infection of the skin and subcutaneous tissue, unspecified: Secondary | ICD-10-CM | POA: Diagnosis not present

## 2021-07-26 DIAGNOSIS — W57XXXA Bitten or stung by nonvenomous insect and other nonvenomous arthropods, initial encounter: Secondary | ICD-10-CM | POA: Diagnosis not present

## 2021-07-26 DIAGNOSIS — Z299 Encounter for prophylactic measures, unspecified: Secondary | ICD-10-CM | POA: Diagnosis not present

## 2021-07-26 DIAGNOSIS — S40862A Insect bite (nonvenomous) of left upper arm, initial encounter: Secondary | ICD-10-CM | POA: Diagnosis not present

## 2021-09-05 DIAGNOSIS — Z299 Encounter for prophylactic measures, unspecified: Secondary | ICD-10-CM | POA: Diagnosis not present

## 2021-09-05 DIAGNOSIS — R7989 Other specified abnormal findings of blood chemistry: Secondary | ICD-10-CM | POA: Diagnosis not present

## 2021-10-07 DIAGNOSIS — I10 Essential (primary) hypertension: Secondary | ICD-10-CM | POA: Diagnosis not present

## 2021-10-07 DIAGNOSIS — Z299 Encounter for prophylactic measures, unspecified: Secondary | ICD-10-CM | POA: Diagnosis not present

## 2021-10-07 DIAGNOSIS — F17210 Nicotine dependence, cigarettes, uncomplicated: Secondary | ICD-10-CM | POA: Diagnosis not present

## 2021-10-07 DIAGNOSIS — R569 Unspecified convulsions: Secondary | ICD-10-CM | POA: Diagnosis not present

## 2021-11-20 DIAGNOSIS — Z299 Encounter for prophylactic measures, unspecified: Secondary | ICD-10-CM | POA: Diagnosis not present

## 2021-12-26 DIAGNOSIS — Z299 Encounter for prophylactic measures, unspecified: Secondary | ICD-10-CM | POA: Diagnosis not present

## 2021-12-26 DIAGNOSIS — R569 Unspecified convulsions: Secondary | ICD-10-CM | POA: Diagnosis not present

## 2021-12-26 DIAGNOSIS — F17210 Nicotine dependence, cigarettes, uncomplicated: Secondary | ICD-10-CM | POA: Diagnosis not present

## 2021-12-26 DIAGNOSIS — I10 Essential (primary) hypertension: Secondary | ICD-10-CM | POA: Diagnosis not present

## 2022-01-27 DIAGNOSIS — Z299 Encounter for prophylactic measures, unspecified: Secondary | ICD-10-CM | POA: Diagnosis not present

## 2022-01-27 DIAGNOSIS — F17210 Nicotine dependence, cigarettes, uncomplicated: Secondary | ICD-10-CM | POA: Diagnosis not present

## 2022-02-04 DIAGNOSIS — Z299 Encounter for prophylactic measures, unspecified: Secondary | ICD-10-CM | POA: Diagnosis not present

## 2022-02-04 DIAGNOSIS — Z87891 Personal history of nicotine dependence: Secondary | ICD-10-CM | POA: Diagnosis not present

## 2022-03-24 DIAGNOSIS — Z299 Encounter for prophylactic measures, unspecified: Secondary | ICD-10-CM | POA: Diagnosis not present

## 2022-03-24 DIAGNOSIS — F17210 Nicotine dependence, cigarettes, uncomplicated: Secondary | ICD-10-CM | POA: Diagnosis not present

## 2022-04-24 DIAGNOSIS — I10 Essential (primary) hypertension: Secondary | ICD-10-CM | POA: Diagnosis not present

## 2022-04-24 DIAGNOSIS — R569 Unspecified convulsions: Secondary | ICD-10-CM | POA: Diagnosis not present

## 2022-04-24 DIAGNOSIS — R7989 Other specified abnormal findings of blood chemistry: Secondary | ICD-10-CM | POA: Diagnosis not present

## 2022-04-24 DIAGNOSIS — Z299 Encounter for prophylactic measures, unspecified: Secondary | ICD-10-CM | POA: Diagnosis not present

## 2022-04-30 DIAGNOSIS — R7989 Other specified abnormal findings of blood chemistry: Secondary | ICD-10-CM | POA: Diagnosis not present

## 2022-05-26 DIAGNOSIS — M2550 Pain in unspecified joint: Secondary | ICD-10-CM | POA: Diagnosis not present

## 2022-05-26 DIAGNOSIS — Z299 Encounter for prophylactic measures, unspecified: Secondary | ICD-10-CM | POA: Diagnosis not present

## 2022-05-26 DIAGNOSIS — F17210 Nicotine dependence, cigarettes, uncomplicated: Secondary | ICD-10-CM | POA: Diagnosis not present

## 2022-06-25 DIAGNOSIS — Z87891 Personal history of nicotine dependence: Secondary | ICD-10-CM | POA: Diagnosis not present

## 2022-06-25 DIAGNOSIS — I10 Essential (primary) hypertension: Secondary | ICD-10-CM | POA: Diagnosis not present

## 2022-06-25 DIAGNOSIS — E349 Endocrine disorder, unspecified: Secondary | ICD-10-CM | POA: Diagnosis not present

## 2022-06-25 DIAGNOSIS — Z299 Encounter for prophylactic measures, unspecified: Secondary | ICD-10-CM | POA: Diagnosis not present

## 2022-08-01 DIAGNOSIS — E349 Endocrine disorder, unspecified: Secondary | ICD-10-CM | POA: Diagnosis not present

## 2022-08-01 DIAGNOSIS — Z299 Encounter for prophylactic measures, unspecified: Secondary | ICD-10-CM | POA: Diagnosis not present

## 2022-10-27 DIAGNOSIS — R69 Illness, unspecified: Secondary | ICD-10-CM | POA: Diagnosis not present

## 2022-10-27 DIAGNOSIS — R7989 Other specified abnormal findings of blood chemistry: Secondary | ICD-10-CM | POA: Diagnosis not present

## 2022-10-27 DIAGNOSIS — Z299 Encounter for prophylactic measures, unspecified: Secondary | ICD-10-CM | POA: Diagnosis not present

## 2022-10-27 DIAGNOSIS — F419 Anxiety disorder, unspecified: Secondary | ICD-10-CM | POA: Diagnosis not present

## 2022-11-25 DIAGNOSIS — F419 Anxiety disorder, unspecified: Secondary | ICD-10-CM | POA: Diagnosis not present

## 2022-11-25 DIAGNOSIS — Z299 Encounter for prophylactic measures, unspecified: Secondary | ICD-10-CM | POA: Diagnosis not present

## 2022-11-25 DIAGNOSIS — Z79891 Long term (current) use of opiate analgesic: Secondary | ICD-10-CM | POA: Diagnosis not present

## 2022-11-25 DIAGNOSIS — F900 Attention-deficit hyperactivity disorder, predominantly inattentive type: Secondary | ICD-10-CM | POA: Diagnosis not present

## 2022-11-25 DIAGNOSIS — R69 Illness, unspecified: Secondary | ICD-10-CM | POA: Diagnosis not present

## 2022-11-25 DIAGNOSIS — F1510 Other stimulant abuse, uncomplicated: Secondary | ICD-10-CM | POA: Diagnosis not present

## 2022-11-25 DIAGNOSIS — F1910 Other psychoactive substance abuse, uncomplicated: Secondary | ICD-10-CM | POA: Diagnosis not present
